# Patient Record
Sex: Male | Born: 1995 | Race: Black or African American | Hispanic: Refuse to answer | Marital: Single | Smoking: Former smoker
Health system: Southern US, Community
[De-identification: ages and names within clinical notes are randomized; demographics above are authoritative.]

## PROBLEM LIST (undated history)

## (undated) DIAGNOSIS — W3400XA Accidental discharge from unspecified firearms or gun, initial encounter: Secondary | ICD-10-CM

## (undated) HISTORY — DX: Accidental discharge from unspecified firearms or gun, initial encounter: W34.00XA

---

## 2020-08-08 ENCOUNTER — Emergency Department (HOSPITAL_COMMUNITY): Payer: BC Managed Care – PPO

## 2020-08-08 ENCOUNTER — Inpatient Hospital Stay (HOSPITAL_COMMUNITY): Payer: BC Managed Care – PPO | Admitting: Anesthesiology

## 2020-08-08 ENCOUNTER — Encounter (HOSPITAL_COMMUNITY): Payer: Self-pay | Admitting: *Deleted

## 2020-08-08 ENCOUNTER — Encounter (HOSPITAL_COMMUNITY): Admission: EM | Disposition: A | Discharge status: Rehabilitation Facility | Payer: Self-pay | Source: Home / Self Care

## 2020-08-08 ENCOUNTER — Inpatient Hospital Stay (HOSPITAL_COMMUNITY)
Admission: EM | Admit: 2020-08-08 | Discharge: 2020-08-12 | DRG: 041 | Disposition: A | Discharge status: Rehabilitation Facility | Payer: BC Managed Care – PPO | Attending: Surgery | Admitting: Surgery

## 2020-08-08 DIAGNOSIS — T1490XA Injury, unspecified, initial encounter: Secondary | ICD-10-CM

## 2020-08-08 DIAGNOSIS — R030 Elevated blood-pressure reading, without diagnosis of hypertension: Secondary | ICD-10-CM | POA: Diagnosis not present

## 2020-08-08 DIAGNOSIS — S52302D Unspecified fracture of shaft of left radius, subsequent encounter for closed fracture with routine healing: Secondary | ICD-10-CM | POA: Diagnosis not present

## 2020-08-08 DIAGNOSIS — Z20822 Contact with and (suspected) exposure to covid-19: Secondary | ICD-10-CM | POA: Diagnosis present

## 2020-08-08 DIAGNOSIS — R202 Paresthesia of skin: Secondary | ICD-10-CM | POA: Diagnosis not present

## 2020-08-08 DIAGNOSIS — W3400XA Accidental discharge from unspecified firearms or gun, initial encounter: Secondary | ICD-10-CM

## 2020-08-08 DIAGNOSIS — S5400XA Injury of ulnar nerve at forearm level, unspecified arm, initial encounter: Secondary | ICD-10-CM | POA: Diagnosis present

## 2020-08-08 DIAGNOSIS — S42402A Unspecified fracture of lower end of left humerus, initial encounter for closed fracture: Secondary | ICD-10-CM

## 2020-08-08 DIAGNOSIS — S5400XD Injury of ulnar nerve at forearm level, unspecified arm, subsequent encounter: Secondary | ICD-10-CM | POA: Diagnosis not present

## 2020-08-08 DIAGNOSIS — G8918 Other acute postprocedural pain: Secondary | ICD-10-CM

## 2020-08-08 DIAGNOSIS — F1290 Cannabis use, unspecified, uncomplicated: Secondary | ICD-10-CM | POA: Diagnosis present

## 2020-08-08 DIAGNOSIS — S52309D Unspecified fracture of shaft of unspecified radius, subsequent encounter for closed fracture with routine healing: Secondary | ICD-10-CM | POA: Diagnosis present

## 2020-08-08 DIAGNOSIS — E46 Unspecified protein-calorie malnutrition: Secondary | ICD-10-CM | POA: Diagnosis present

## 2020-08-08 DIAGNOSIS — S41132A Puncture wound without foreign body of left upper arm, initial encounter: Secondary | ICD-10-CM | POA: Diagnosis present

## 2020-08-08 DIAGNOSIS — Z23 Encounter for immunization: Secondary | ICD-10-CM | POA: Diagnosis not present

## 2020-08-08 DIAGNOSIS — S5410XA Injury of median nerve at forearm level, unspecified arm, initial encounter: Principal | ICD-10-CM | POA: Diagnosis present

## 2020-08-08 DIAGNOSIS — S45192A Other specified injury of brachial artery, left side, initial encounter: Secondary | ICD-10-CM

## 2020-08-08 DIAGNOSIS — K5903 Drug induced constipation: Secondary | ICD-10-CM | POA: Diagnosis not present

## 2020-08-08 DIAGNOSIS — T07XXXA Unspecified multiple injuries, initial encounter: Secondary | ICD-10-CM | POA: Diagnosis not present

## 2020-08-08 DIAGNOSIS — R7401 Elevation of levels of liver transaminase levels: Secondary | ICD-10-CM | POA: Diagnosis not present

## 2020-08-08 DIAGNOSIS — S71132A Puncture wound without foreign body, left thigh, initial encounter: Secondary | ICD-10-CM | POA: Diagnosis present

## 2020-08-08 DIAGNOSIS — M21372 Foot drop, left foot: Secondary | ICD-10-CM | POA: Diagnosis present

## 2020-08-08 DIAGNOSIS — S52132A Displaced fracture of neck of left radius, initial encounter for closed fracture: Secondary | ICD-10-CM | POA: Diagnosis present

## 2020-08-08 DIAGNOSIS — D62 Acute posthemorrhagic anemia: Secondary | ICD-10-CM | POA: Diagnosis present

## 2020-08-08 DIAGNOSIS — W3400XD Accidental discharge from unspecified firearms or gun, subsequent encounter: Secondary | ICD-10-CM | POA: Diagnosis not present

## 2020-08-08 DIAGNOSIS — Z6835 Body mass index (BMI) 35.0-35.9, adult: Secondary | ICD-10-CM | POA: Diagnosis not present

## 2020-08-08 DIAGNOSIS — E8809 Other disorders of plasma-protein metabolism, not elsewhere classified: Secondary | ICD-10-CM | POA: Diagnosis not present

## 2020-08-08 DIAGNOSIS — M792 Neuralgia and neuritis, unspecified: Secondary | ICD-10-CM | POA: Diagnosis not present

## 2020-08-08 DIAGNOSIS — F4311 Post-traumatic stress disorder, acute: Secondary | ICD-10-CM | POA: Diagnosis present

## 2020-08-08 HISTORY — PX: ARTERY REPAIR: SHX559

## 2020-08-08 LAB — BPAM FFP
Blood Product Expiration Date: 202110132359
Blood Product Expiration Date: 202110302359
ISSUE DATE / TIME: 202110090425
ISSUE DATE / TIME: 202110090425
Unit Type and Rh: 6200
Unit Type and Rh: 6200

## 2020-08-08 LAB — ABO/RH: ABO/RH(D): AB POS

## 2020-08-08 LAB — COMPREHENSIVE METABOLIC PANEL
ALT: 65 U/L — ABNORMAL HIGH (ref 0–44)
AST: 28 U/L (ref 15–41)
Albumin: 3.5 g/dL (ref 3.5–5.0)
Alkaline Phosphatase: 51 U/L (ref 38–126)
Anion gap: 14 (ref 5–15)
BUN: 8 mg/dL (ref 6–20)
CO2: 19 mmol/L — ABNORMAL LOW (ref 22–32)
Calcium: 8.9 mg/dL (ref 8.9–10.3)
Chloride: 105 mmol/L (ref 98–111)
Creatinine, Ser: 1.08 mg/dL (ref 0.61–1.24)
GFR, Estimated: >60 mL/min (ref >60)
Glucose, Bld: 141 mg/dL — ABNORMAL HIGH (ref 70–99)
Potassium: 3.1 mmol/L — ABNORMAL LOW (ref 3.5–5.1)
Sodium: 138 mmol/L (ref 135–145)
Total Bilirubin: 0.3 mg/dL (ref 0.3–1.2)
Total Protein: 6.5 g/dL (ref 6.5–8.1)

## 2020-08-08 LAB — PREPARE FRESH FROZEN PLASMA
Unit division: 0
Unit division: 0

## 2020-08-08 LAB — POCT I-STAT 7, (LYTES, BLD GAS, ICA,H+H)
Acid-base deficit: 3 mmol/L — ABNORMAL HIGH (ref 0.0–2.0)
Bicarbonate: 24.2 mmol/L (ref 20.0–28.0)
Calcium, Ion: 1.16 mmol/L (ref 1.15–1.40)
HCT: 35 % — ABNORMAL LOW (ref 39.0–52.0)
Hemoglobin: 11.9 g/dL — ABNORMAL LOW (ref 13.0–17.0)
O2 Saturation: 100 %
Patient temperature: 35.7
Potassium: 3.1 mmol/L — ABNORMAL LOW (ref 3.5–5.1)
Sodium: 139 mmol/L (ref 135–145)
TCO2: 26 mmol/L (ref 22–32)
pCO2 arterial: 48.4 mmHg — ABNORMAL HIGH (ref 32.0–48.0)
pH, Arterial: 7.3 — ABNORMAL LOW (ref 7.350–7.450)
pO2, Arterial: 227 mmHg — ABNORMAL HIGH (ref 83.0–108.0)

## 2020-08-08 LAB — RESPIRATORY PANEL BY RT PCR (FLU A&B, COVID)
Influenza A by PCR: NEGATIVE
Influenza B by PCR: NEGATIVE
SARS Coronavirus 2 by RT PCR: NEGATIVE

## 2020-08-08 LAB — CBC
HCT: 42.2 % (ref 39.0–52.0)
Hemoglobin: 13.8 g/dL (ref 13.0–17.0)
MCH: 27.8 pg (ref 26.0–34.0)
MCHC: 32.7 g/dL (ref 30.0–36.0)
MCV: 84.9 fL (ref 80.0–100.0)
Platelets: 233 10*3/uL (ref 150–400)
RBC: 4.97 MIL/uL (ref 4.22–5.81)
RDW: 12.6 % (ref 11.5–15.5)
WBC: 11.5 10*3/uL — ABNORMAL HIGH (ref 4.0–10.5)
nRBC: 0 % (ref 0.0–0.2)

## 2020-08-08 LAB — HIV ANTIBODY (ROUTINE TESTING W REFLEX): HIV Screen 4th Generation wRfx: NONREACTIVE

## 2020-08-08 LAB — PROTIME-INR
INR: 1.1 (ref 0.8–1.2)
Prothrombin Time: 13.5 seconds (ref 11.4–15.2)

## 2020-08-08 LAB — ETHANOL: Alcohol, Ethyl (B): 21 mg/dL — ABNORMAL HIGH (ref <10)

## 2020-08-08 LAB — LACTIC ACID, PLASMA: Lactic Acid, Venous: 4.4 mmol/L (ref 0.5–1.9)

## 2020-08-08 SURGERY — REPAIR, ARTERY, BRACHIAL
Anesthesia: General | Site: Arm Upper | Laterality: Left

## 2020-08-08 MED ORDER — ACETAMINOPHEN 10 MG/ML IV SOLN
INTRAVENOUS | Status: AC
  Filled 2020-08-08: qty 100

## 2020-08-08 MED ORDER — ONDANSETRON HCL 4 MG/2ML IJ SOLN
INTRAMUSCULAR | Status: DC | PRN
  Administered 2020-08-08: 4 mg via INTRAVENOUS

## 2020-08-08 MED ORDER — CEFAZOLIN SODIUM-DEXTROSE 2-3 GM-%(50ML) IV SOLR
INTRAVENOUS | Status: DC | PRN
  Administered 2020-08-08: 2 g via INTRAVENOUS

## 2020-08-08 MED ORDER — SUCCINYLCHOLINE 20MG/ML (10ML) SYRINGE FOR MEDFUSION PUMP - OPTIME
INTRAMUSCULAR | Status: DC | PRN
  Administered 2020-08-08: 160 mg via INTRAVENOUS

## 2020-08-08 MED ORDER — FENTANYL CITRATE (PF) 250 MCG/5ML IJ SOLN
INTRAMUSCULAR | Status: AC
  Filled 2020-08-08: qty 5

## 2020-08-08 MED ORDER — PROTAMINE SULFATE 10 MG/ML IV SOLN
INTRAVENOUS | Status: DC | PRN
  Administered 2020-08-08: 50 mg via INTRAVENOUS

## 2020-08-08 MED ORDER — HEPARIN SODIUM (PORCINE) 1000 UNIT/ML IJ SOLN
INTRAMUSCULAR | Status: DC | PRN
  Administered 2020-08-08: 5000 [IU] via INTRAVENOUS
  Administered 2020-08-08: 3000 [IU] via INTRAVENOUS

## 2020-08-08 MED ORDER — ASPIRIN EC 81 MG PO TBEC
81.0000 mg | DELAYED_RELEASE_TABLET | Freq: Every day | ORAL | Status: DC
  Administered 2020-08-09 – 2020-08-12 (×4): 81 mg via ORAL
  Filled 2020-08-08 (×5): qty 1

## 2020-08-08 MED ORDER — CEFAZOLIN SODIUM-DEXTROSE 1-4 GM/50ML-% IV SOLN
1.0000 g | Freq: Once | INTRAVENOUS | Status: AC
  Administered 2020-08-08: 1 g via INTRAVENOUS
  Filled 2020-08-08: qty 50

## 2020-08-08 MED ORDER — SODIUM CHLORIDE 0.9 % IV SOLN
INTRAVENOUS | Status: AC | PRN
  Administered 2020-08-08 (×2): 1000 mL via INTRAVENOUS

## 2020-08-08 MED ORDER — DOCUSATE SODIUM 100 MG PO CAPS
100.0000 mg | ORAL_CAPSULE | Freq: Two times a day (BID) | ORAL | Status: DC
  Administered 2020-08-08 – 2020-08-12 (×8): 100 mg via ORAL
  Filled 2020-08-08 (×8): qty 1

## 2020-08-08 MED ORDER — OXYCODONE HCL 5 MG PO TABS
5.0000 mg | ORAL_TABLET | Freq: Once | ORAL | Status: AC | PRN
  Administered 2020-08-08: 5 mg via ORAL

## 2020-08-08 MED ORDER — SODIUM CHLORIDE 0.9 % IV SOLN: INTRAVENOUS | Status: DC | PRN

## 2020-08-08 MED ORDER — 0.9 % SODIUM CHLORIDE (POUR BTL) OPTIME
TOPICAL | Status: DC | PRN
  Administered 2020-08-08: 1000 mL

## 2020-08-08 MED ORDER — FENTANYL CITRATE (PF) 100 MCG/2ML IJ SOLN
25.0000 ug | INTRAMUSCULAR | Status: DC | PRN
  Administered 2020-08-08 (×2): 50 ug via INTRAVENOUS

## 2020-08-08 MED ORDER — ETOMIDATE 2 MG/ML IV SOLN
INTRAVENOUS | Status: DC | PRN
  Administered 2020-08-08: 16 mg via INTRAVENOUS

## 2020-08-08 MED ORDER — ACETAMINOPHEN 500 MG PO TABS
1000.0000 mg | ORAL_TABLET | Freq: Four times a day (QID) | ORAL | Status: DC
  Administered 2020-08-08 – 2020-08-12 (×15): 1000 mg via ORAL
  Filled 2020-08-08 (×17): qty 2

## 2020-08-08 MED ORDER — IOHEXOL 350 MG/ML SOLN
100.0000 mL | Freq: Once | INTRAVENOUS | Status: AC | PRN
  Administered 2020-08-08: 100 mL via INTRAVENOUS

## 2020-08-08 MED ORDER — OXYCODONE HCL 5 MG PO TABS
ORAL_TABLET | ORAL | Status: AC
  Filled 2020-08-08: qty 1

## 2020-08-08 MED ORDER — DEXMEDETOMIDINE HCL IN NACL 400 MCG/100ML IV SOLN
INTRAVENOUS | Status: DC | PRN
  Administered 2020-08-08 (×2): 8 ug via INTRAVENOUS
  Administered 2020-08-08: 4 ug via INTRAVENOUS

## 2020-08-08 MED ORDER — FENTANYL CITRATE (PF) 100 MCG/2ML IJ SOLN
INTRAMUSCULAR | Status: AC | PRN
  Administered 2020-08-08: 100 ug via INTRAVENOUS

## 2020-08-08 MED ORDER — ONDANSETRON HCL 4 MG/2ML IJ SOLN: 4.0000 mg | Freq: Once | INTRAMUSCULAR | Status: DC | PRN

## 2020-08-08 MED ORDER — ONDANSETRON HCL 4 MG/2ML IJ SOLN: 4.0000 mg | Freq: Four times a day (QID) | INTRAMUSCULAR | Status: DC | PRN

## 2020-08-08 MED ORDER — LACTATED RINGERS IV SOLN: INTRAVENOUS | Status: DC

## 2020-08-08 MED ORDER — MORPHINE SULFATE (PF) 2 MG/ML IV SOLN
2.0000 mg | INTRAVENOUS | Status: DC | PRN
  Administered 2020-08-08 – 2020-08-12 (×6): 2 mg via INTRAVENOUS
  Filled 2020-08-08 (×6): qty 1

## 2020-08-08 MED ORDER — OXYCODONE HCL 5 MG/5ML PO SOLN: 5.0000 mg | Freq: Once | ORAL | Status: AC | PRN

## 2020-08-08 MED ORDER — SUGAMMADEX SODIUM 200 MG/2ML IV SOLN
INTRAVENOUS | Status: DC | PRN
  Administered 2020-08-08: 400 mg via INTRAVENOUS

## 2020-08-08 MED ORDER — SODIUM CHLORIDE 0.9 % IV SOLN
INTRAVENOUS | Status: DC | PRN
  Administered 2020-08-08: 500 mL

## 2020-08-08 MED ORDER — FENTANYL CITRATE (PF) 100 MCG/2ML IJ SOLN
INTRAMUSCULAR | Status: AC
  Filled 2020-08-08: qty 2

## 2020-08-08 MED ORDER — ONDANSETRON 4 MG PO TBDP: 4.0000 mg | ORAL_TABLET | Freq: Four times a day (QID) | ORAL | Status: DC | PRN

## 2020-08-08 MED ORDER — SODIUM CHLORIDE 0.9 % IV SOLN
INTRAVENOUS | Status: AC
  Filled 2020-08-08: qty 1.2

## 2020-08-08 MED ORDER — ACETAMINOPHEN 10 MG/ML IV SOLN
INTRAVENOUS | Status: DC | PRN
  Administered 2020-08-08: 1000 mg via INTRAVENOUS

## 2020-08-08 MED ORDER — DEXAMETHASONE SODIUM PHOSPHATE 10 MG/ML IJ SOLN
INTRAMUSCULAR | Status: DC | PRN
  Administered 2020-08-08: 10 mg via INTRAVENOUS

## 2020-08-08 MED ORDER — TETANUS-DIPHTH-ACELL PERTUSSIS 5-2.5-18.5 LF-MCG/0.5 IM SUSP
0.5000 mL | Freq: Once | INTRAMUSCULAR | Status: AC
  Administered 2020-08-08: 0.5 mL via INTRAMUSCULAR
  Filled 2020-08-08: qty 0.5

## 2020-08-08 MED ORDER — FENTANYL CITRATE (PF) 250 MCG/5ML IJ SOLN
INTRAMUSCULAR | Status: DC | PRN
Start: 2020-08-08 — End: 2020-08-08
  Administered 2020-08-08: 150 ug via INTRAVENOUS
  Administered 2020-08-08: 50 ug via INTRAVENOUS
  Administered 2020-08-08 (×2): 25 ug via INTRAVENOUS

## 2020-08-08 MED ORDER — ROCURONIUM 10MG/ML (10ML) SYRINGE FOR MEDFUSION PUMP - OPTIME
INTRAVENOUS | Status: DC | PRN
  Administered 2020-08-08: 60 mg via INTRAVENOUS
  Administered 2020-08-08: 20 mg via INTRAVENOUS

## 2020-08-08 MED ORDER — LACTATED RINGERS IV SOLN: INTRAVENOUS | Status: DC | PRN

## 2020-08-08 MED ORDER — OXYCODONE HCL 5 MG PO TABS
5.0000 mg | ORAL_TABLET | ORAL | Status: DC | PRN
  Administered 2020-08-08 – 2020-08-10 (×6): 10 mg via ORAL
  Administered 2020-08-10 – 2020-08-11 (×3): 5 mg via ORAL
  Administered 2020-08-11 (×4): 10 mg via ORAL
  Administered 2020-08-11: 5 mg via ORAL
  Administered 2020-08-12 (×2): 10 mg via ORAL
  Filled 2020-08-08: qty 1
  Filled 2020-08-08 (×3): qty 2
  Filled 2020-08-08: qty 1
  Filled 2020-08-08 (×3): qty 2
  Filled 2020-08-08: qty 1
  Filled 2020-08-08 (×6): qty 2
  Filled 2020-08-08: qty 1

## 2020-08-08 SURGICAL SUPPLY — 74 items
ADH SKN CLS APL DERMABOND .7 (GAUZE/BANDAGES/DRESSINGS) ×4
BNDG CMPR 9X4 STRL LF SNTH (GAUZE/BANDAGES/DRESSINGS)
BNDG ELASTIC 4X5.8 VLCR STR LF (GAUZE/BANDAGES/DRESSINGS) ×3 IMPLANT
BNDG ESMARK 4X9 LF (GAUZE/BANDAGES/DRESSINGS) IMPLANT
CANISTER SUCT 3000ML PPV (MISCELLANEOUS) ×6 IMPLANT
CATH EMB 3FR 40CM (CATHETERS) ×3 IMPLANT
CATH EMB 4FR 80CM (CATHETERS) IMPLANT
CATH EMB 5FR 80CM (CATHETERS) IMPLANT
CLIP VESOCCLUDE MED 24/CT (CLIP) ×3 IMPLANT
CLIP VESOCCLUDE SM WIDE 24/CT (CLIP) ×3 IMPLANT
COVER WAND RF STERILE (DRAPES) IMPLANT
CUFF TOURN SGL QUICK 18X4 (TOURNIQUET CUFF) IMPLANT
CUFF TOURN SGL QUICK 24 (TOURNIQUET CUFF)
CUFF TRNQT CYL 24X4X16.5-23 (TOURNIQUET CUFF) IMPLANT
DECANTER SPIKE VIAL GLASS SM (MISCELLANEOUS) IMPLANT
DERMABOND ADVANCED (GAUZE/BANDAGES/DRESSINGS) ×2
DERMABOND ADVANCED .7 DNX12 (GAUZE/BANDAGES/DRESSINGS) ×4 IMPLANT
DRAIN CHANNEL 15F RND FF W/TCR (WOUND CARE) IMPLANT
DRAPE ORTHO SPLIT 77X108 STRL (DRAPES) ×3
DRAPE SURG ORHT 6 SPLT 77X108 (DRAPES) ×2 IMPLANT
DRAPE X-RAY CASS 24X20 (DRAPES) IMPLANT
DRSG ADAPTIC 3X8 NADH LF (GAUZE/BANDAGES/DRESSINGS) ×3 IMPLANT
ELECT REM PT RETURN 9FT ADLT (ELECTROSURGICAL) ×6
ELECTRODE REM PT RTRN 9FT ADLT (ELECTROSURGICAL) ×4 IMPLANT
EVACUATOR SILICONE 100CC (DRAIN) IMPLANT
GAUZE SPONGE 4X4 12PLY STRL LF (GAUZE/BANDAGES/DRESSINGS) ×3 IMPLANT
GAUZE XEROFORM 5X9 LF (GAUZE/BANDAGES/DRESSINGS) ×3 IMPLANT
GLOVE BIO SURGEON STRL SZ 6.5 (GLOVE) ×3 IMPLANT
GLOVE BIO SURGEON STRL SZ7 (GLOVE) ×6 IMPLANT
GLOVE BIO SURGEON STRL SZ7.5 (GLOVE) ×9 IMPLANT
GLOVE BIOGEL PI IND STRL 6.5 (GLOVE) ×2 IMPLANT
GLOVE BIOGEL PI IND STRL 7.0 (GLOVE) ×2 IMPLANT
GLOVE BIOGEL PI IND STRL 7.5 (GLOVE) ×4 IMPLANT
GLOVE BIOGEL PI IND STRL 8 (GLOVE) ×2 IMPLANT
GLOVE BIOGEL PI INDICATOR 6.5 (GLOVE) ×1
GLOVE BIOGEL PI INDICATOR 7.0 (GLOVE) ×1
GLOVE BIOGEL PI INDICATOR 7.5 (GLOVE) ×2
GLOVE BIOGEL PI INDICATOR 8 (GLOVE) ×1
GLOVE SURG SS PI 6.5 STRL IVOR (GLOVE) ×3 IMPLANT
GLOVE SURG SS PI 7.5 STRL IVOR (GLOVE) ×3 IMPLANT
GOWN STRL REUS W/ TWL LRG LVL3 (GOWN DISPOSABLE) ×12 IMPLANT
GOWN STRL REUS W/ TWL XL LVL3 (GOWN DISPOSABLE) ×4 IMPLANT
GOWN STRL REUS W/TWL LRG LVL3 (GOWN DISPOSABLE) ×18
GOWN STRL REUS W/TWL XL LVL3 (GOWN DISPOSABLE) ×6
HEMOSTAT SNOW SURGICEL 2X4 (HEMOSTASIS) IMPLANT
KIT BASIN OR (CUSTOM PROCEDURE TRAY) ×3 IMPLANT
KIT TURNOVER KIT B (KITS) ×6 IMPLANT
NS IRRIG 1000ML POUR BTL (IV SOLUTION) ×9 IMPLANT
PACK CV ACCESS (CUSTOM PROCEDURE TRAY) IMPLANT
PACK PERIPHERAL VASCULAR (CUSTOM PROCEDURE TRAY) ×3 IMPLANT
PAD ARMBOARD 7.5X6 YLW CONV (MISCELLANEOUS) ×6 IMPLANT
PAD CAST 4YDX4 CTTN HI CHSV (CAST SUPPLIES) ×8 IMPLANT
PADDING CAST COTTON 4X4 STRL (CAST SUPPLIES) ×12
SET COLLECT BLD 21X3/4 12 (NEEDLE) IMPLANT
SPLINT FIBERGLASS 4X30 (CAST SUPPLIES) ×3 IMPLANT
STOPCOCK 4 WAY LG BORE MALE ST (IV SETS) IMPLANT
SUT ETHILON 3 0 PS 1 (SUTURE) ×6 IMPLANT
SUT MNCRL AB 4-0 PS2 18 (SUTURE) IMPLANT
SUT PROLENE 5 0 C 1 24 (SUTURE) IMPLANT
SUT PROLENE 6 0 BV (SUTURE) ×6 IMPLANT
SUT SILK 3 0 (SUTURE)
SUT SILK 3-0 18XBRD TIE 12 (SUTURE) IMPLANT
SUT VIC AB 2-0 CT1 27 (SUTURE) ×6
SUT VIC AB 2-0 CT1 TAPERPNT 27 (SUTURE) ×4 IMPLANT
SUT VIC AB 3-0 SH 27 (SUTURE) ×12
SUT VIC AB 3-0 SH 27X BRD (SUTURE) ×8 IMPLANT
SUT VIC AB 4-0 PS2 18 (SUTURE) ×9 IMPLANT
SYR 3ML LL SCALE MARK (SYRINGE) ×3 IMPLANT
TAPE CLOTH SURG 4X10 WHT LF (GAUZE/BANDAGES/DRESSINGS) ×3 IMPLANT
TOWEL GREEN STERILE (TOWEL DISPOSABLE) ×6 IMPLANT
TRAY FOLEY MTR SLVR 16FR STAT (SET/KITS/TRAYS/PACK) IMPLANT
TUBING EXTENTION W/L.L. (IV SETS) IMPLANT
UNDERPAD 30X36 HEAVY ABSORB (UNDERPADS AND DIAPERS) ×6 IMPLANT
WATER STERILE IRR 1000ML POUR (IV SOLUTION) ×6 IMPLANT

## 2020-08-08 NOTE — Anesthesia Procedure Notes (Signed)
Arterial Line Insertion Start/End10/07/2020 5:05 AM, 08/08/2020 5:10 AM Performed by: Kipp Brood, MD, anesthesiologist  Preanesthetic checklist: patient identified and monitors and equipment checked Right, radial was placed Catheter size: 20 G Hand hygiene performed  and maximum sterile barriers used   Attempts: 2 Procedure performed without using ultrasound guided technique. Following insertion, dressing applied and Biopatch. Patient tolerated the procedure well with no immediate complications.

## 2020-08-08 NOTE — ED Notes (Signed)
Transported with bobby, rn to OR

## 2020-08-08 NOTE — ED Notes (Signed)
Per bobby rn, he took the patients clothing and gave to chaplin who gave to patient family prior to the patient going to the OR
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
## 2020-08-08 NOTE — ED Notes (Signed)
Per commander, md pt is ready to go to or, waiting to receive ok to transport to or

## 2020-08-08 NOTE — ED Notes (Signed)
Patient transported to CT 

## 2020-08-08 NOTE — Anesthesia Postprocedure Evaluation (Signed)
Anesthesia Post Note  Patient: Steve [name]  Procedure(s) Performed: Exploration Left Brachial Artery Sheath; Left Greater Saphenous Vein Harvest; Embolectomy Brachial Artery; Interposition Bypass Left Brachial-Left Brachial (Left Arm Upper)     Patient location during evaluation: PACU Anesthesia Type: General Level of consciousness: awake and alert Pain management: satisfactory to patient Vital Signs Assessment: post-procedure vital signs reviewed and stable Respiratory status: spontaneous breathing, nonlabored ventilation and respiratory function stable Cardiovascular status: blood pressure returned to baseline and stable Postop Assessment: no apparent nausea or vomiting Anesthetic complications: no   No complications documented.  Last Vitals:  Vitals:   08/08/20 0830 08/08/20 0845  BP: (!) 163/96 (!) 145/96  Pulse: 82 67  Resp: (!) 22 17  Temp:  (!) 36.3 C  SpO2: 98% 100%    Last Pain:  Vitals:   08/08/20 0845  TempSrc:   PainSc: 8     LLE Motor Response: Purposeful movement (08/08/20 0845) LLE Sensation: Full sensation (08/08/20 0845) RLE Motor Response: Purposeful movement (08/08/20 0845) RLE Sensation: Full sensation (08/08/20 0845)      Beryle Lathe

## 2020-08-08 NOTE — ED Notes (Signed)
Upgraded to level 1, CT 2 is ready for the patient.
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
## 2020-08-08 NOTE — Transfer of Care (Signed)
Immediate Anesthesia Transfer of Care Note  Patient: Steve [name]  Procedure(s) Performed: BRACHIAL ARTERY REPAIR (Left )  Patient Location: PACU  Anesthesia Type:General  Level of Consciousness: drowsy  Airway & Oxygen Therapy: Patient Spontanous Breathing and Patient connected to face mask oxygen  Post-op Assessment: Report given to RN and Post -op Vital signs reviewed and stable  Post vital signs: Reviewed and stable  Last Vitals:  Vitals Value Taken Time  BP 131/93 08/08/20 0745  Temp    Pulse 85 08/08/20 0756  Resp 19 08/08/20 0756  SpO2 100 % 08/08/20 0756  Vitals shown include unvalidated device data.  Last Pain:  Vitals:   08/08/20 0445  TempSrc: Temporal  PainSc:          Complications: No complications documented.

## 2020-08-08 NOTE — Anesthesia Preprocedure Evaluation (Signed)
Anesthesia Evaluation  Patient identified by MRN, date of birth, ID band Patient awake    Reviewed: Patient's Chart, lab work & pertinent test results, Unable to perform ROS - Chart review onlyPreop documentation limited or incomplete due to emergent nature of procedure.  Airway Mallampati: III   Neck ROM: Full    Dental  (+) Teeth Intact   Pulmonary    breath sounds clear to auscultation       Cardiovascular  Rhythm:Regular Rate:Tachycardia     Neuro/Psych    GI/Hepatic   Endo/Other    Renal/GU      Musculoskeletal   Abdominal (+) + obese,   Peds  Hematology   Anesthesia Other Findings Wound   Reproductive/Obstetrics                             Anesthesia Physical Anesthesia Plan  ASA: IV  Anesthesia Plan: General   Post-op Pain Management:    Induction: Intravenous, Cricoid pressure planned and Rapid sequence  PONV Risk Score and Plan: Ondansetron and Dexamethasone  Airway Management Planned: Oral ETT  Additional Equipment:   Intra-op Plan:   Post-operative Plan: Extubation in OR  Informed Consent: I have reviewed the patients History and Physical, chart, labs and discussed the procedure including the risks, benefits and alternatives for the proposed anesthesia with the patient or authorized representative who has indicated his/her understanding and acceptance.       Plan Discussed with: CRNA and Anesthesiologist  Anesthesia Plan Comments:         Anesthesia Quick Evaluation

## 2020-08-08 NOTE — ED Notes (Signed)
Family placed in family consult
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
## 2020-08-08 NOTE — Progress Notes (Signed)
Orthopedic Tech Progress Note Patient Details:  [name] [employer] [name] 10/31/1876 [id number] Level 2 Trauma  Patient ID: Steve [name], male   DOB: 10/31/1876, 24 y.o.   MRN: [id number]   [name] 08/08/2020, 3:59 AM
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
## 2020-08-08 NOTE — Anesthesia Procedure Notes (Signed)
Procedure Name: Intubation Date/Time: 08/08/2020 5:07 AM Performed by: Molli Hazard, CRNA Pre-anesthesia Checklist: Patient identified, Emergency Drugs available, Suction available and Patient being monitored Patient Re-evaluated:Patient Re-evaluated prior to induction Oxygen Delivery Method: Circle system utilized Preoxygenation: Pre-oxygenation with 100% oxygen Induction Type: IV induction, Rapid sequence and Cricoid Pressure applied Laryngoscope Size: Miller and 2 Grade View: Grade I Tube type: Oral Tube size: 7.5 mm Number of attempts: 1 Airway Equipment and Method: Stylet Secured at: 21 cm Tube secured with: Tape Dental Injury: Teeth and Oropharynx as per pre-operative assessment

## 2020-08-08 NOTE — Progress Notes (Signed)
   Patient evaluated in his room.  He has pain in his left upper extremity as well as bilateral lower extremities left greater than right.  Palpable left radial artery pulse.  Palpable bilateral dorsalis pedis pulses all extremities appear well-perfused.  He needs aspirin and pulse checks from a vascular standpoint.  Raissa Dam C. Randie Heinz, MD [facility] Office: [phone] Pager: [phone]

## 2020-08-09 ENCOUNTER — Encounter (HOSPITAL_COMMUNITY): Payer: Self-pay | Admitting: Vascular Surgery

## 2020-08-09 ENCOUNTER — Inpatient Hospital Stay (HOSPITAL_COMMUNITY): Payer: BC Managed Care – PPO

## 2020-08-09 LAB — TYPE AND SCREEN
ABO/RH(D): AB POS
Antibody Screen: NEGATIVE
Unit division: 0
Unit division: 0
Unit division: 0

## 2020-08-09 LAB — CBC
HCT: 33.7 % — ABNORMAL LOW (ref 39.0–52.0)
Hemoglobin: 10.8 g/dL — ABNORMAL LOW (ref 13.0–17.0)
MCH: 27.1 pg (ref 26.0–34.0)
MCHC: 32 g/dL (ref 30.0–36.0)
MCV: 84.5 fL (ref 80.0–100.0)
Platelets: 169 10*3/uL (ref 150–400)
RBC: 3.99 MIL/uL — ABNORMAL LOW (ref 4.22–5.81)
RDW: 13 % (ref 11.5–15.5)
WBC: 14.8 10*3/uL — ABNORMAL HIGH (ref 4.0–10.5)
nRBC: 0 % (ref 0.0–0.2)

## 2020-08-09 LAB — BPAM RBC
Blood Product Expiration Date: 202110232359
Blood Product Expiration Date: 202110232359
Blood Product Expiration Date: 202110252359
ISSUE DATE / TIME: 202110090417
ISSUE DATE / TIME: 202110090915
ISSUE DATE / TIME: 202110090915
Unit Type and Rh: 5100
Unit Type and Rh: 5100
Unit Type and Rh: 5100

## 2020-08-09 LAB — BASIC METABOLIC PANEL
Anion gap: 7 (ref 5–15)
BUN: 6 mg/dL (ref 6–20)
CO2: 27 mmol/L (ref 22–32)
Calcium: 8.7 mg/dL — ABNORMAL LOW (ref 8.9–10.3)
Chloride: 105 mmol/L (ref 98–111)
Creatinine, Ser: 0.72 mg/dL (ref 0.61–1.24)
GFR, Estimated: >60 mL/min (ref >60)
Glucose, Bld: 132 mg/dL — ABNORMAL HIGH (ref 70–99)
Potassium: 4 mmol/L (ref 3.5–5.1)
Sodium: 139 mmol/L (ref 135–145)

## 2020-08-09 LAB — BLOOD PRODUCT ORDER (VERBAL) VERIFICATION

## 2020-08-09 MED ORDER — METHOCARBAMOL 500 MG PO TABS
1000.0000 mg | ORAL_TABLET | Freq: Three times a day (TID) | ORAL | Status: DC
  Administered 2020-08-09 – 2020-08-12 (×10): 1000 mg via ORAL
  Filled 2020-08-09 (×10): qty 2

## 2020-08-09 MED ORDER — GABAPENTIN 300 MG PO CAPS
300.0000 mg | ORAL_CAPSULE | Freq: Three times a day (TID) | ORAL | Status: DC
  Administered 2020-08-09 (×2): 300 mg via ORAL
  Filled 2020-08-09 (×3): qty 1

## 2020-08-09 MED ORDER — ENOXAPARIN SODIUM 30 MG/0.3ML ~~LOC~~ SOLN
30.0000 mg | Freq: Two times a day (BID) | SUBCUTANEOUS | Status: DC
  Administered 2020-08-09 – 2020-08-12 (×7): 30 mg via SUBCUTANEOUS
  Filled 2020-08-09 (×7): qty 0.3

## 2020-08-09 MED ORDER — ENOXAPARIN SODIUM 40 MG/0.4ML ~~LOC~~ SOLN: 40.0000 mg | SUBCUTANEOUS | Status: DC
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
## 2020-08-10 LAB — BASIC METABOLIC PANEL
Anion gap: 8 (ref 5–15)
BUN: 8 mg/dL (ref 6–20)
CO2: 26 mmol/L (ref 22–32)
Calcium: 8.7 mg/dL — ABNORMAL LOW (ref 8.9–10.3)
Chloride: 104 mmol/L (ref 98–111)
Creatinine, Ser: 0.77 mg/dL (ref 0.61–1.24)
GFR, Estimated: >60 mL/min (ref >60)
Glucose, Bld: 98 mg/dL (ref 70–99)
Potassium: 4.1 mmol/L (ref 3.5–5.1)
Sodium: 138 mmol/L (ref 135–145)

## 2020-08-10 LAB — CBC
HCT: 31.4 % — ABNORMAL LOW (ref 39.0–52.0)
Hemoglobin: 10 g/dL — ABNORMAL LOW (ref 13.0–17.0)
MCH: 27.6 pg (ref 26.0–34.0)
MCHC: 31.8 g/dL (ref 30.0–36.0)
MCV: 86.7 fL (ref 80.0–100.0)
Platelets: 147 10*3/uL — ABNORMAL LOW (ref 150–400)
RBC: 3.62 MIL/uL — ABNORMAL LOW (ref 4.22–5.81)
RDW: 13.1 % (ref 11.5–15.5)
WBC: 9.4 10*3/uL (ref 4.0–10.5)
nRBC: 0 % (ref 0.0–0.2)

## 2020-08-10 MED ORDER — POLYETHYLENE GLYCOL 3350 17 G PO PACK: 17.0000 g | PACK | Freq: Every day | ORAL | Status: DC

## 2020-08-10 MED ORDER — TRAMADOL HCL 50 MG PO TABS
50.0000 mg | ORAL_TABLET | Freq: Four times a day (QID) | ORAL | Status: DC
  Administered 2020-08-10 – 2020-08-12 (×9): 50 mg via ORAL
  Filled 2020-08-10 (×9): qty 1

## 2020-08-10 MED ORDER — GABAPENTIN 400 MG PO CAPS
400.0000 mg | ORAL_CAPSULE | Freq: Three times a day (TID) | ORAL | Status: DC
  Administered 2020-08-10 (×3): 400 mg via ORAL
  Filled 2020-08-10 (×2): qty 1

## 2020-08-10 NOTE — TOC CAGE-AID Note (Signed)
Transition of Care [facility]) - CAGE-AID Screening   Patient Details  Name: Steve [name] MRN: [id number] Date of Birth: [birth year]  [facility] of [facility]) CM/SW Contact:    Emeterio Reeve, [place] Phone Number: 08/10/2020, 1:50 PM   Clinical Narrative:  CSW met with pt at bedside. CSW introduced self and explained her role at the hospital.  Pt reports he has an alcoholic beverage a few times a month. Pt denies substance use. Pt reports he wants to stop smoking cigarettes resources. CSW gave pt smoking cessation resources. Pt did not need any other resources at this time.   CAGE-AID Screening:    Have You Ever Felt You Ought to Cut Down on Your Drinking or Drug Use?: No Have People Annoyed You By Critizing Your Drinking Or Drug Use?: No Have You Felt Bad Or Guilty About Your Drinking Or Drug Use?: No Have You Ever Had a Drink or Used Drugs First Thing In The Morning to Steady Your Nerves or to Get Rid of a Hangover?: No CAGE-AID Score: 0  Substance Abuse Education Offered: Yes     Blima Ledger, [place] Social Worker [phone]
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
## 2020-08-10 NOTE — Progress Notes (Signed)
Inpatient Rehab Admissions Coordinator Note:   Per PT recommendations, pt was screened for CIR candidacy by Estill Dooms, PT, DPT.  At this time we are recommending a CIR consult and I will place an order per our protocol.  Please contact me with questions.   Estill Dooms, PT, DPT [phone] 08/10/20 10:27 AM
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
## 2020-08-11 ENCOUNTER — Telehealth: Payer: Self-pay

## 2020-08-11 LAB — BASIC METABOLIC PANEL
Anion gap: 10 (ref 5–15)
BUN: 6 mg/dL (ref 6–20)
CO2: 25 mmol/L (ref 22–32)
Calcium: 9 mg/dL (ref 8.9–10.3)
Chloride: 103 mmol/L (ref 98–111)
Creatinine, Ser: 0.68 mg/dL (ref 0.61–1.24)
GFR, Estimated: >60 mL/min (ref >60)
Glucose, Bld: 85 mg/dL (ref 70–99)
Potassium: 3.9 mmol/L (ref 3.5–5.1)
Sodium: 138 mmol/L (ref 135–145)

## 2020-08-11 LAB — CBC
HCT: 32.4 % — ABNORMAL LOW (ref 39.0–52.0)
Hemoglobin: 10.3 g/dL — ABNORMAL LOW (ref 13.0–17.0)
MCH: 27.2 pg (ref 26.0–34.0)
MCHC: 31.8 g/dL (ref 30.0–36.0)
MCV: 85.5 fL (ref 80.0–100.0)
Platelets: 181 10*3/uL (ref 150–400)
RBC: 3.79 MIL/uL — ABNORMAL LOW (ref 4.22–5.81)
RDW: 13.1 % (ref 11.5–15.5)
WBC: 9.9 10*3/uL (ref 4.0–10.5)
nRBC: 0 % (ref 0.0–0.2)

## 2020-08-11 MED ORDER — GABAPENTIN 300 MG PO CAPS
600.0000 mg | ORAL_CAPSULE | Freq: Three times a day (TID) | ORAL | Status: DC
  Administered 2020-08-11 – 2020-08-12 (×4): 600 mg via ORAL
  Filled 2020-08-11 (×4): qty 2

## 2020-08-11 MED ORDER — POLYETHYLENE GLYCOL 3350 17 G PO PACK
17.0000 g | PACK | Freq: Two times a day (BID) | ORAL | Status: DC
  Administered 2020-08-11 – 2020-08-12 (×3): 17 g via ORAL
  Filled 2020-08-11 (×3): qty 1

## 2020-08-11 NOTE — Progress Notes (Signed)
Report called to [facility] RN. Patient and all belongings transported via bed without incident. Patient mother made aware of change in room.

## 2020-08-11 NOTE — Telephone Encounter (Signed)
Spoke to Ward from PT at [facility]. She asked if her was non weight bearing on the arm and as tolerated on the leg. Discussed with PA, nothing would preclude this from our standpoint but instructed Morrie Sheldon to confirm with ortho.
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
## 2020-08-11 NOTE — Progress Notes (Signed)
Inpatient Rehabilitation Admissions Coordinator  I await bed availability in the next 24 to 48 hrs for a Cir admit. I met at bedside with patient and he is in agreement.  Danne Baxter, RN, MSN Rehab Admissions Coordinator [phone] 08/11/2020 1:33 PM

## 2020-08-11 NOTE — Progress Notes (Addendum)
°  Progress Note    08/11/2020 7:53 AM 3 Days Post-Op  Subjective: numbness in fingers of L hand however motor is intact   Vitals:   08/11/20 0400 08/11/20 0753  BP: (!) 153/89 137/83  Pulse: 70 70  Resp: 18 17  Temp: 98.3 F (36.8 C) 97.9 F (36.6 C)  SpO2: 100% 100%   Physical Exam: Lungs:  Non labored Incisions:  Ortho brace left in place Extremities:  Palpable L radial pulse Neurologic: A&O  CBC    Component Value Date/Time   WBC 9.9 08/11/2020 0405   RBC 3.79 (L) 08/11/2020 0405   HGB 10.3 (L) 08/11/2020 0405   HCT 32.4 (L) 08/11/2020 0405   PLT 181 08/11/2020 0405   MCV 85.5 08/11/2020 0405   MCH 27.2 08/11/2020 0405   MCHC 31.8 08/11/2020 0405   RDW 13.1 08/11/2020 0405    BMET    Component Value Date/Time   NA 138 08/11/2020 0405   K 3.9 08/11/2020 0405   CL 103 08/11/2020 0405   CO2 25 08/11/2020 0405   GLUCOSE 85 08/11/2020 0405   BUN 6 08/11/2020 0405   CREATININE 0.68 08/11/2020 0405   CALCIUM 9.0 08/11/2020 0405   GFRNONAA >60 08/11/2020 0405    INR    Component Value Date/Time   INR 1.1 08/08/2020 0400     Intake/Output Summary (Last 24 hours) at 08/11/2020 0753 Last data filed at 08/11/2020 0100 Gross per 24 hour  Intake 380 ml  Output 850 ml  Net -470 ml     Assessment/Plan:  24 y.o. male is s/p repair of L brachial artery with L saphenous interposition graft 3 Days Post-Op   L hand well perfused with palpable radial pulse L saphenous harvest incision unremarkable; unable to examine L UE incision with ortho brace Continue aspirin Transfer to floor ok from vascular standpoint   Emilie Rutter, PA-C [facility] [phone] 08/11/2020 7:53 AM  I have independently interviewed and examined patient and agree with PA assessment and plan above.   Lianah Peed C. Randie Heinz, MD [facility] [place] Office: [phone] Pager: [phone]
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
## 2020-08-12 ENCOUNTER — Encounter (HOSPITAL_COMMUNITY): Payer: Self-pay | Admitting: Physical Medicine & Rehabilitation

## 2020-08-12 ENCOUNTER — Inpatient Hospital Stay (HOSPITAL_COMMUNITY)
Admission: RE | Admit: 2020-08-12 | Discharge: 2020-08-21 | DRG: 560 | Disposition: A | Discharge status: Home Health | Payer: BC Managed Care – PPO | Source: Intra-hospital | Attending: Physical Medicine & Rehabilitation | Admitting: Physical Medicine & Rehabilitation

## 2020-08-12 DIAGNOSIS — W3400XD Accidental discharge from unspecified firearms or gun, subsequent encounter: Secondary | ICD-10-CM | POA: Diagnosis not present

## 2020-08-12 DIAGNOSIS — S5400XD Injury of ulnar nerve at forearm level, unspecified arm, subsequent encounter: Secondary | ICD-10-CM | POA: Diagnosis not present

## 2020-08-12 DIAGNOSIS — K5903 Drug induced constipation: Secondary | ICD-10-CM | POA: Diagnosis not present

## 2020-08-12 DIAGNOSIS — S52302D Unspecified fracture of shaft of left radius, subsequent encounter for closed fracture with routine healing: Principal | ICD-10-CM

## 2020-08-12 DIAGNOSIS — M792 Neuralgia and neuritis, unspecified: Secondary | ICD-10-CM

## 2020-08-12 DIAGNOSIS — M21372 Foot drop, left foot: Secondary | ICD-10-CM | POA: Diagnosis not present

## 2020-08-12 DIAGNOSIS — E46 Unspecified protein-calorie malnutrition: Secondary | ICD-10-CM | POA: Diagnosis present

## 2020-08-12 DIAGNOSIS — F4311 Post-traumatic stress disorder, acute: Secondary | ICD-10-CM

## 2020-08-12 DIAGNOSIS — W3400XA Accidental discharge from unspecified firearms or gun, initial encounter: Secondary | ICD-10-CM | POA: Diagnosis not present

## 2020-08-12 DIAGNOSIS — R52 Pain, unspecified: Secondary | ICD-10-CM | POA: Diagnosis not present

## 2020-08-12 DIAGNOSIS — D62 Acute posthemorrhagic anemia: Secondary | ICD-10-CM | POA: Diagnosis present

## 2020-08-12 DIAGNOSIS — Z6835 Body mass index (BMI) 35.0-35.9, adult: Secondary | ICD-10-CM

## 2020-08-12 DIAGNOSIS — G8918 Other acute postprocedural pain: Secondary | ICD-10-CM

## 2020-08-12 DIAGNOSIS — R7401 Elevation of levels of liver transaminase levels: Secondary | ICD-10-CM

## 2020-08-12 DIAGNOSIS — M7989 Other specified soft tissue disorders: Secondary | ICD-10-CM | POA: Diagnosis not present

## 2020-08-12 DIAGNOSIS — T07XXXA Unspecified multiple injuries, initial encounter: Secondary | ICD-10-CM

## 2020-08-12 DIAGNOSIS — R202 Paresthesia of skin: Secondary | ICD-10-CM

## 2020-08-12 DIAGNOSIS — R030 Elevated blood-pressure reading, without diagnosis of hypertension: Secondary | ICD-10-CM | POA: Diagnosis not present

## 2020-08-12 DIAGNOSIS — S52309D Unspecified fracture of shaft of unspecified radius, subsequent encounter for closed fracture with routine healing: Secondary | ICD-10-CM | POA: Diagnosis present

## 2020-08-12 DIAGNOSIS — E8809 Other disorders of plasma-protein metabolism, not elsewhere classified: Secondary | ICD-10-CM | POA: Diagnosis not present

## 2020-08-12 DIAGNOSIS — R609 Edema, unspecified: Secondary | ICD-10-CM | POA: Diagnosis not present

## 2020-08-12 LAB — BASIC METABOLIC PANEL
Anion gap: 7 (ref 5–15)
BUN: 8 mg/dL (ref 6–20)
CO2: 27 mmol/L (ref 22–32)
Calcium: 8.7 mg/dL — ABNORMAL LOW (ref 8.9–10.3)
Chloride: 103 mmol/L (ref 98–111)
Creatinine, Ser: 0.8 mg/dL (ref 0.61–1.24)
GFR, Estimated: >60 mL/min (ref >60)
Glucose, Bld: 110 mg/dL — ABNORMAL HIGH (ref 70–99)
Potassium: 4.4 mmol/L (ref 3.5–5.1)
Sodium: 137 mmol/L (ref 135–145)

## 2020-08-12 LAB — CBC
HCT: 32.5 % — ABNORMAL LOW (ref 39.0–52.0)
Hemoglobin: 10.4 g/dL — ABNORMAL LOW (ref 13.0–17.0)
MCH: 27.4 pg (ref 26.0–34.0)
MCHC: 32 g/dL (ref 30.0–36.0)
MCV: 85.8 fL (ref 80.0–100.0)
Platelets: 162 10*3/uL (ref 150–400)
RBC: 3.79 MIL/uL — ABNORMAL LOW (ref 4.22–5.81)
RDW: 13 % (ref 11.5–15.5)
WBC: 10.7 10*3/uL — ABNORMAL HIGH (ref 4.0–10.5)
nRBC: 0 % (ref 0.0–0.2)

## 2020-08-12 MED ORDER — OXYCODONE HCL 5 MG PO TABS
5.0000 mg | ORAL_TABLET | ORAL | Status: DC | PRN
  Administered 2020-08-12 – 2020-08-18 (×30): 10 mg via ORAL
  Administered 2020-08-18: 5 mg via ORAL
  Administered 2020-08-18: 10 mg via ORAL
  Administered 2020-08-18: 5 mg via ORAL
  Administered 2020-08-19 – 2020-08-20 (×4): 10 mg via ORAL
  Filled 2020-08-12 (×33): qty 2
  Filled 2020-08-12: qty 1
  Filled 2020-08-12 (×6): qty 2

## 2020-08-12 MED ORDER — ENOXAPARIN SODIUM 30 MG/0.3ML ~~LOC~~ SOLN: 30.0000 mg | Freq: Two times a day (BID) | SUBCUTANEOUS | Status: DC

## 2020-08-12 MED ORDER — ONDANSETRON 4 MG PO TBDP: 4.0000 mg | ORAL_TABLET | Freq: Four times a day (QID) | ORAL | Status: DC | PRN

## 2020-08-12 MED ORDER — ENOXAPARIN SODIUM 30 MG/0.3ML ~~LOC~~ SOLN
30.0000 mg | Freq: Two times a day (BID) | SUBCUTANEOUS | Status: DC
  Administered 2020-08-13 – 2020-08-21 (×17): 30 mg via SUBCUTANEOUS
  Filled 2020-08-12 (×17): qty 0.3

## 2020-08-12 MED ORDER — ASPIRIN EC 81 MG PO TBEC
81.0000 mg | DELAYED_RELEASE_TABLET | Freq: Every day | ORAL | Status: DC
  Administered 2020-08-13 – 2020-08-21 (×9): 81 mg via ORAL
  Filled 2020-08-12 (×9): qty 1

## 2020-08-12 MED ORDER — TRAMADOL HCL 50 MG PO TABS
50.0000 mg | ORAL_TABLET | Freq: Four times a day (QID) | ORAL | Status: DC
  Administered 2020-08-12 – 2020-08-21 (×32): 50 mg via ORAL
  Filled 2020-08-12 (×35): qty 1

## 2020-08-12 MED ORDER — GABAPENTIN 300 MG PO CAPS
600.0000 mg | ORAL_CAPSULE | Freq: Three times a day (TID) | ORAL | Status: DC
  Administered 2020-08-12 – 2020-08-13 (×2): 600 mg via ORAL
  Filled 2020-08-12 (×2): qty 2

## 2020-08-12 MED ORDER — ACETAMINOPHEN 325 MG PO TABS
325.0000 mg | ORAL_TABLET | ORAL | Status: DC | PRN
  Administered 2020-08-12 – 2020-08-21 (×15): 650 mg via ORAL
  Filled 2020-08-12 (×15): qty 2

## 2020-08-12 MED ORDER — POLYETHYLENE GLYCOL 3350 17 G PO PACK
17.0000 g | PACK | Freq: Two times a day (BID) | ORAL | Status: DC
  Administered 2020-08-12 – 2020-08-20 (×13): 17 g via ORAL
  Filled 2020-08-12 (×18): qty 1

## 2020-08-12 MED ORDER — ONDANSETRON HCL 4 MG/2ML IJ SOLN: 4.0000 mg | Freq: Four times a day (QID) | INTRAMUSCULAR | Status: DC | PRN

## 2020-08-12 MED ORDER — DOCUSATE SODIUM 100 MG PO CAPS
100.0000 mg | ORAL_CAPSULE | Freq: Two times a day (BID) | ORAL | Status: DC
  Administered 2020-08-12 – 2020-08-21 (×18): 100 mg via ORAL
  Filled 2020-08-12 (×18): qty 1

## 2020-08-12 MED ORDER — METHOCARBAMOL 500 MG PO TABS
1000.0000 mg | ORAL_TABLET | Freq: Three times a day (TID) | ORAL | Status: DC
  Administered 2020-08-12 – 2020-08-13 (×2): 1000 mg via ORAL
  Filled 2020-08-12 (×2): qty 2
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
## 2020-08-12 NOTE — Plan of Care (Signed)
  Problem: Education: Goal: Knowledge of General Education information will improve Description: Including pain rating scale, medication(s)/side effects and non-pharmacologic comfort measures Outcome: Completed/Met   Problem: Health Behavior/Discharge Planning: Goal: Ability to manage health-related needs will improve Outcome: Completed/Met   Problem: Clinical Measurements: Goal: Ability to maintain clinical measurements within normal limits will improve Outcome: Completed/Met Goal: Will remain free from infection Outcome: Completed/Met Goal: Diagnostic test results will improve Outcome: Completed/Met Goal: Respiratory complications will improve Outcome: Completed/Met Goal: Cardiovascular complication will be avoided Outcome: Completed/Met   Problem: Activity: Goal: Risk for activity intolerance will decrease Outcome: Completed/Met   Problem: Nutrition: Goal: Adequate nutrition will be maintained Outcome: Completed/Met   Problem: Coping: Goal: Level of anxiety will decrease Outcome: Completed/Met   Problem: Elimination: Goal: Will not experience complications related to bowel motility Outcome: Completed/Met Goal: Will not experience complications related to urinary retention Outcome: Completed/Met   Problem: Pain Managment: Goal: General experience of comfort will improve Outcome: Completed/Met   Problem: Safety: Goal: Ability to remain free from injury will improve Outcome: Completed/Met   Problem: Skin Integrity: Goal: Risk for impaired skin integrity will decrease Outcome: Completed/Met   Problem: Acute Rehab PT Goals(only PT should resolve) Goal: Pt will Roll Supine to Side Outcome: Completed/Met Goal: Pt Will Go Supine/Side To Sit Outcome: Completed/Met Goal: Patient Will Transfer Sit To/From Stand Outcome: Completed/Met Goal: Pt Will Transfer Bed To Chair/Chair To Bed Outcome: Completed/Met Goal: Pt Will Ambulate Outcome: Completed/Met Goal: Pt Will  Go Up/Down Stairs Outcome: Completed/Met   Problem: Acute Rehab OT Goals (only OT should resolve) Goal: Pt. Will Perform Grooming Outcome: Completed/Met Goal: Pt. Will Perform Lower Body Bathing Outcome: Completed/Met Goal: Pt. Will Perform Upper Body Dressing Outcome: Completed/Met Goal: Pt. Will Perform Lower Body Dressing Outcome: Completed/Met Goal: Pt. Will Transfer To Toilet Outcome: Completed/Met Goal: Pt. Will Perform Toileting-Clothing Manipulation Outcome: Completed/Met Goal: Pt/Caregiver Will Perform Home Exercise Program Outcome: Completed/Met   Report called to Ed, RN, on CIR unit at [facility].  Discussed with patient routine on re-hab and what to expect.
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
## 2020-08-12 NOTE — Progress Notes (Signed)
Patient admitted from [facility]. Patient appears alert and has asked for pain medication when available. Patient stated that he is allergic to tomatoes and tomato products.

## 2020-08-12 NOTE — Progress Notes (Signed)
Inpatient Rehabilitation Medication Review by a Pharmacist  A complete drug regimen review was completed for this patient to identify any potential clinically significant medication issues.  Clinically significant medication issues were identified:  no  Check AMION for pharmacist assigned to patient if future medication questions/issues arise during this admission.  Pharmacist comments:   Time spent performing this drug regimen review (minutes):  5   Fredonia Highland, PharmD, BCPS Clinical Pharmacist Please check AMION for all [facility] Pharmacy numbers 08/12/2020

## 2020-08-12 NOTE — Progress Notes (Signed)
Patient ID: Steve [name], male   DOB: [birth year], 24 y.o.   MRN: [id number] Patient seen and examined this AM.  We discussed all issues.  I adjusted his splint.  He has good early flexion extension.  He has no signs of instability infection or dystrophy.  I discussed all issues with him at length and the findings.  At present time I would recommend consideration for continued range of motion.  We will progress him to a cast in the next 7 to 10 days and continue conservative treatment of the fracture and observation of the median nerve neuropraxia.  Patient I had a long conversation at bedside about these issues and other findings.  I discussed with him the prognosis plans and other issues germane to his predicament. He is certainly stable to transition in my opinion to rehab versus outpatient observatory care.  Given his foot drop issues he may require extended therapeutic endeavors and AFO brace etc.   Madline Oesterling MD
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
## 2020-08-12 NOTE — Progress Notes (Addendum)
[facility]  Subjective  - Still with numbness in the left hand and left foot.   Objective 129/63 67 97.6 F (36.4 C) (Oral) 19 100%  Intake/Output Summary (Last 24 hours) at 08/12/2020 0733 Last data filed at 08/11/2020 2300 Gross per 24 hour  Intake 237 ml  Output 1350 ml  Net -1113 ml   Palpable radial artery left UE and palpable DP left foot. Sensation decreased left hand, and left foot.  No toe or ankle motor intact left LE.   Left hand motor intact, elevated in foam splint. Left LE incision healing well  Assessment/Planning: POD # 6  1.  Left upper arm brachial artery and sheath exploration 2.  Ligation of left brachial vein 3.  Left upper extremity brachial artery thromboembolectomy with 3 Fogarty 4.  Harvest left greater saphenous vein 5.  Left brachial artery to brachial artery interposition bypass with reversed greater saphenous vein  Comminuted fracture of the radius proximally followed by Dr. Amanda Pea.    Well perfused left UE & LE with palpable pulses. Stable disposition from a vascular point of view.   Mosetta Pigeon 08/12/2020 7:33 AM --  Laboratory Lab Results: Recent Labs    08/11/20 0405 08/12/20 0322  WBC 9.9 10.7*  HGB 10.3* 10.4*  HCT 32.4* 32.5*  PLT 181 162   BMET Recent Labs    08/11/20 0405 08/12/20 0322  NA 138 137  K 3.9 4.4  CL 103 103  CO2 25 27  GLUCOSE 85 110*  BUN 6 8  CREATININE 0.68 0.80  CALCIUM 9.0 8.7*    COAG Lab Results  Component Value Date   INR 1.1 08/08/2020   No results found for: PTT  I have independently interviewed and examined patient and agree with PA assessment and plan above.  Okay for DC from vascular standpoint.  We will have him follow-up and 3 to 4 weeks with left upper extremity duplex.  Taylor Levick C. Randie Heinz, MD [facility] [place] Office: [phone] Pager: [phone]
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
## 2020-08-12 NOTE — Progress Notes (Signed)
Inpatient Rehabilitation Admissions Coordinator  I met with patient at bedside to offer Cir admit and patient is in agreement. I have alerted acute team, Trauma team and TOC. I will make the arrangements to admit today.  Danne Baxter, RN, MSN Rehab Admissions Coordinator [phone] 08/12/2020 10:51 AM

## 2020-08-13 ENCOUNTER — Inpatient Hospital Stay (HOSPITAL_COMMUNITY): Payer: Self-pay | Admitting: Occupational Therapy

## 2020-08-13 ENCOUNTER — Inpatient Hospital Stay (HOSPITAL_COMMUNITY): Payer: Self-pay

## 2020-08-13 ENCOUNTER — Inpatient Hospital Stay (HOSPITAL_COMMUNITY): Payer: BC Managed Care – PPO

## 2020-08-13 ENCOUNTER — Inpatient Hospital Stay (HOSPITAL_COMMUNITY): Payer: Self-pay | Admitting: Physical Therapy

## 2020-08-13 DIAGNOSIS — R609 Edema, unspecified: Secondary | ICD-10-CM

## 2020-08-13 DIAGNOSIS — W3400XA Accidental discharge from unspecified firearms or gun, initial encounter: Secondary | ICD-10-CM

## 2020-08-13 DIAGNOSIS — R52 Pain, unspecified: Secondary | ICD-10-CM

## 2020-08-13 DIAGNOSIS — K5903 Drug induced constipation: Secondary | ICD-10-CM

## 2020-08-13 DIAGNOSIS — T07XXXA Unspecified multiple injuries, initial encounter: Secondary | ICD-10-CM

## 2020-08-13 DIAGNOSIS — D62 Acute posthemorrhagic anemia: Secondary | ICD-10-CM

## 2020-08-13 DIAGNOSIS — M7989 Other specified soft tissue disorders: Secondary | ICD-10-CM

## 2020-08-13 DIAGNOSIS — G8918 Other acute postprocedural pain: Secondary | ICD-10-CM

## 2020-08-13 DIAGNOSIS — R7401 Elevation of levels of liver transaminase levels: Secondary | ICD-10-CM

## 2020-08-13 LAB — CBC WITH DIFFERENTIAL/PLATELET
Abs Immature Granulocytes: 0.13 10*3/uL — ABNORMAL HIGH (ref 0.00–0.07)
Basophils Absolute: 0 10*3/uL (ref 0.0–0.1)
Basophils Relative: 0 %
Eosinophils Absolute: 0.2 10*3/uL (ref 0.0–0.5)
Eosinophils Relative: 2 %
HCT: 32.1 % — ABNORMAL LOW (ref 39.0–52.0)
Hemoglobin: 10.1 g/dL — ABNORMAL LOW (ref 13.0–17.0)
Immature Granulocytes: 1 %
Lymphocytes Relative: 18 %
Lymphs Abs: 1.8 10*3/uL (ref 0.7–4.0)
MCH: 26.9 pg (ref 26.0–34.0)
MCHC: 31.5 g/dL (ref 30.0–36.0)
MCV: 85.6 fL (ref 80.0–100.0)
Monocytes Absolute: 0.9 10*3/uL (ref 0.1–1.0)
Monocytes Relative: 9 %
Neutro Abs: 6.9 10*3/uL (ref 1.7–7.7)
Neutrophils Relative %: 70 %
Platelets: 214 10*3/uL (ref 150–400)
RBC: 3.75 MIL/uL — ABNORMAL LOW (ref 4.22–5.81)
RDW: 13.2 % (ref 11.5–15.5)
WBC: 10 10*3/uL (ref 4.0–10.5)
nRBC: 0 % (ref 0.0–0.2)

## 2020-08-13 LAB — COMPREHENSIVE METABOLIC PANEL
ALT: 75 U/L — ABNORMAL HIGH (ref 0–44)
AST: 43 U/L — ABNORMAL HIGH (ref 15–41)
Albumin: 3.2 g/dL — ABNORMAL LOW (ref 3.5–5.0)
Alkaline Phosphatase: 56 U/L (ref 38–126)
Anion gap: 10 (ref 5–15)
BUN: 9 mg/dL (ref 6–20)
CO2: 25 mmol/L (ref 22–32)
Calcium: 9.1 mg/dL (ref 8.9–10.3)
Chloride: 102 mmol/L (ref 98–111)
Creatinine, Ser: 0.63 mg/dL (ref 0.61–1.24)
GFR, Estimated: >60 mL/min (ref >60)
Glucose, Bld: 107 mg/dL — ABNORMAL HIGH (ref 70–99)
Potassium: 3.8 mmol/L (ref 3.5–5.1)
Sodium: 137 mmol/L (ref 135–145)
Total Bilirubin: 0.9 mg/dL (ref 0.3–1.2)
Total Protein: 6.6 g/dL (ref 6.5–8.1)

## 2020-08-13 MED ORDER — GABAPENTIN 300 MG PO CAPS
900.0000 mg | ORAL_CAPSULE | Freq: Three times a day (TID) | ORAL | Status: DC
  Administered 2020-08-13 – 2020-08-20 (×21): 900 mg via ORAL
  Filled 2020-08-13 (×22): qty 3

## 2020-08-13 MED ORDER — METHOCARBAMOL 500 MG PO TABS
1000.0000 mg | ORAL_TABLET | Freq: Four times a day (QID) | ORAL | Status: DC
  Administered 2020-08-13 – 2020-08-21 (×32): 1000 mg via ORAL
  Filled 2020-08-13 (×32): qty 2

## 2020-08-13 NOTE — Progress Notes (Signed)
Bilateral Lower Ext. study completed.   See CVProc for preliminary results.   Carlyn Mullenbach, RDMS, RVT 
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
## 2020-08-13 NOTE — Progress Notes (Signed)
Inpatient Rehabilitation Center Individual Statement of Services  Patient Name:  Steve [name]  Date:  08/13/2020  Welcome to the Inpatient Rehabilitation Center.  Our goal is to provide you with an individualized program based on your diagnosis and situation, designed to meet your specific needs.  With this comprehensive rehabilitation program, you will be expected to participate in at least 3 hours of rehabilitation therapies Monday-Friday, with modified therapy programming on the weekends.  Your rehabilitation program will include the following services:  Physical Therapy (PT), Occupational Therapy (OT), 24 hour per day rehabilitation nursing, Therapeutic Recreaction (TR), Neuropsychology, Care Coordinator, Rehabilitation Medicine, Nutrition Services and Pharmacy Services  Weekly team conferences will be held on Wednesday to discuss your progress.  Your Inpatient Rehabilitation Care Coordinator will talk with you frequently to get your input and to update you on team discussions.  Team conferences with you and your family in attendance may also be held.  Expected length of stay: 10-12 days Overall anticipated outcome: supervision with cueing  Depending on your progress and recovery, your program may change. Your Inpatient Rehabilitation Care Coordinator will coordinate services and will keep you informed of any changes. Your Inpatient Rehabilitation Care Coordinator's name and contact numbers are listed  below.  The following services may also be recommended but are not provided by the Inpatient Rehabilitation Center:   Driving Evaluations  Home Health Rehabiltiation Services  Outpatient Rehabilitation Services  Vocational Rehabilitation   Arrangements will be made to provide these services after discharge if needed.  Arrangements include referral to agencies that provide these services.  Your insurance has been verified to be:  Uninsured-Medicaid pending Your primary doctor is:   None  Pertinent information will be shared with your doctor and your insurance company.  Inpatient Rehabilitation Care Coordinator:  Dossie Der, [place] [phone] or (Luna [place]  Information discussed with and copy given to patient by: Lucy Chris, 08/13/2020, 11:12 AM

## 2020-08-13 NOTE — Progress Notes (Signed)
Inpatient Rehabilitation  Patient information reviewed and entered into eRehab system by Arthur Aydelotte M. Amarise Lillo, M.A., CCC/SLP, PPS Coordinator.  Information including medical coding, functional ability and quality indicators will be reviewed and updated through discharge.    

## 2020-08-14 ENCOUNTER — Inpatient Hospital Stay (HOSPITAL_COMMUNITY): Payer: Self-pay

## 2020-08-14 ENCOUNTER — Inpatient Hospital Stay (HOSPITAL_COMMUNITY): Payer: Self-pay | Admitting: Occupational Therapy

## 2020-08-14 DIAGNOSIS — R030 Elevated blood-pressure reading, without diagnosis of hypertension: Secondary | ICD-10-CM

## 2020-08-14 MED ORDER — MORPHINE SULFATE ER 15 MG PO TBCR
15.0000 mg | EXTENDED_RELEASE_TABLET | Freq: Every day | ORAL | Status: DC
  Administered 2020-08-14 – 2020-08-18 (×5): 15 mg via ORAL
  Filled 2020-08-14 (×5): qty 1

## 2020-08-14 NOTE — Progress Notes (Signed)
Physical Therapy Session Note  Patient Details  Name: Steve [name] MRN: [id number] Date of Birth: 06/13/1996  Today's Date: 08/14/2020 PT Individual Time: 8657-8469 and 1001-1056 PT Individual Time Calculation (min): 54 min and 55 min  Short Term Goals: Week 1:  PT Short Term Goal 1 (Week 1): Pt will transfer stand<>pivot with LRAD and CGA PT Short Term Goal 2 (Week 1): Pt will ambulate 81ft with LRAD min A PT Short Term Goal 3 (Week 1): Pt will perform simulated car transfer with LRAD and CGA  Skilled Therapeutic Interventions/Progress Updates:   Treatment Session 1: 6295-2841 54 min Received pt supine in bed, pt agreeable to therapy, and reported pain 10/10 in L UE (premedicated). Repositioning and rest breaks given to reduce pain levels. Pt reported he did not sleep last night due to high pain levels and requested to speak with MD. Session with emphasis on functional mobility/transfers, generalized strengthening, dynamic standing balance/coordination, ambulation, and improved activity tolerance. Pt with good adherence to L UE NWB precautions throughout session. Pt transferred supine<>sitting EOB with HOB elevated and supervision and transferred bed<>WC stand<>pivot without AD and min A with encouragement to weight bear through L LE. Pt brushed teeth sitting in WC at sink with supervision. Pt transported to therapy gym in [facility] total A for time management purposes and ambulated 68ft with hemi walker and min A. Pt avoiding weight bearing onto L LE due to reports of increased pain, resulting in hopping on R LE. MD present for morning rounds to discuss pt's pain levels and medications. Pt transferred sit<>stand with hemi walker and CGA and attempted alternating toe taps to 3in step x 4 reps however pt with increased difficulty weight shifting to L LE. Instead switched to lateral weight shifting with hemi walker x10 reps and min A and pt able to to put weight onto L LE. RN present to administer  medication and pt later reported pain decrease to 8/10. Returned to alternating toe taps and pt demonstrated improvements weight shifting onto L LE to perform x 16 reps of alternating toe taps to 3in step. Pt required multiple rest/water breaks throughout session demonstrating increased diaphoresis. Stand<>pivot with hemi walker and min A x 2 additional trials throughout session. Pt transported back to room in [facility] total A and transferred sit<>supine with supervision. Concluded session with pt supine in bed, needs within reach, and bed alarm on. Therapist provided fresh water for pt.   Treatment Session 2: 1001-1056 55 min  Received pt supine in bed, pt agreeable to therapy, and reported pain 8/10 in L UE (premedicated). RN present to administer medication during session. Repositioning and rest breaks given to reduce pain levels. Session with emphasis on functional mobility/transfers, generalized strengthening, dynamic standing balance/coordination, ambulation, and improved activity tolerance. Pt performed bed mobility with supervision x 2 trials throughout session and transferred bed<>WC stand<>pivot without AD and CGA x 2 trials. Pt performed WC mobility 148ft using R UE and R LE to dayroom with supervision and cues for propulsion technique. Stand<>pivot WC<>mat without AD CGA. Therapist performed passive ROM into ankle PF/DF/inversion/eversion and passive hamstring stretch for 1 minute to promote ROM. Pt ambulated 40ft x 2 trials using hemi walker and min A with cues to weight shift onto L LE; pt continues to hop on R LE due to fear of trusting L LE. Worked on dynamic standing balance and weight shifting onto L LE playing cornhole x 4 trials without AD and CGA for balance with therapist supporting L UE. Pt became  emotional during session thinking about his young daughters and how much he misses them. Therapist provided emotional support and encouragement. Stand<>pivot mat<>WC without AD and CGA. Pt transported  back to room in [facility] total A. Concluded session with pt supine in bed, needs within reach, and bed alarm on. Therapist assisted with positioning L UE for comfort.   Therapy Documentation Precautions:  Precautions Precautions: Fall Precaution Comments: L foot numbness Restrictions Weight Bearing Restrictions: Yes LUE Weight Bearing: Non weight bearing LLE Weight Bearing: Weight bearing as tolerated   Therapy/Group: Individual Therapy Martin Majestic PT, DPT   08/14/2020, 7:22 AM

## 2020-08-14 NOTE — Progress Notes (Signed)
Occupational Therapy Session Note  Patient Details  Name: Steve [name] MRN: [id number] Date of Birth: 06-Nov-1995  Today's Date: 08/14/2020 OT Individual Time: 1300-1400 OT Individual Time Calculation (min): 60 min    Short Term Goals: Week 1:  OT Short Term Goal 1 (Week 1): STGs equal to LTGs set at supervision to modified independent level overall.  Skilled Therapeutic Interventions/Progress Updates:    Treatment session with focus on functional transfers, sit <> stand, dynamic standing balance, and modified techniques for bathing and dressing due to LUE splinting/NWB.  Pt received semi-reclined in bed expressing desire to wash up due to excessive sweating during previous therapy sessions.  Pt completed bed mobility supervision and completed stand pivot transfer to w/c CGA without AD.  Engaged in bathing at sit > stand level with CGA when standing to wash buttocks and perineal area.  Therapist providing min cues to WB through LLE as tolerated during dynamic standing for bathing and LB dressing.  Therapist provided assistance at LUE due to reports of pain when not supported.  Therapist asked PA if there was an option for support when upright and ambulating due to pain in dependent position - PA to reach out to surgeon.  Pt reports feelings of depression when in hospital room all the time, therefore engaged in dependent w/c mobility around unit and to large windows for emotional support.  Pt returned to room and transferred back to bed CGA without AD.  RN arrived at end of session to provide pain meds and pt reporting fatigue.  Pt remained semi-reclined in bed with LUE elevated on pillows to comfort with all needs in reach.  Therapy Documentation Precautions:  Precautions Precautions: Fall Precaution Comments: L foot numbness Restrictions Weight Bearing Restrictions: Yes LUE Weight Bearing: Non weight bearing LLE Weight Bearing: Weight bearing as tolerated Pain: Pain Assessment Pain  Scale: 0-10 Pain Score: 8  Pain Type: Acute pain Pain Location: Arm Pain Orientation: Left Pain Descriptors / Indicators: Tingling;Throbbing Pain Frequency: Constant Pain Onset: On-going Patients Stated Pain Goal: 4 Pain Intervention(s): Medication (See eMAR) (tylenol)   Therapy/Group: Individual Therapy  Rosalio Loud 08/14/2020, 2:15 PM
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
## 2020-08-14 NOTE — Progress Notes (Signed)
Orthopedic Tech Progress Note Patient Details:  Maksim Peregoy Jan 24, 1996 [id number]  Ortho Devices Type of Ortho Device: Prafo boot/shoe Ortho Device/Splint Location: LLE Ortho Device/Splint Interventions: Application, Adjustment   Post Interventions Patient Tolerated: Well Instructions Provided: Adjustment of device   Marilu Rylander E Sinjin Amero 08/14/2020, 8:27 PM

## 2020-08-14 NOTE — Progress Notes (Signed)
Patient ID: Steve [name], male   DOB: 25-Feb-1996, 24 y.o.   MRN: [id number] Spoke with Mom who was asking if pt had medicaid yet, due to getting bills. Placed a call to [employer] assist who is working on [employer] and asked her to call Mom. She said she would and discuss medicaid status. Pt's young daughter's receive medicaid he should be eligible also.
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
## 2020-08-15 NOTE — Progress Notes (Signed)
Physical Therapy Session Note  Patient Details  Name: Steve [name] MRN: [id number] Date of Birth: August 29, 1996  Today's Date: 08/15/2020 PT Individual Time: 0810-0922 PT Individual Time Calculation (min): 72 min   Short Term Goals: Week 1:  PT Short Term Goal 1 (Week 1): Pt will transfer stand<>pivot with LRAD and CGA PT Short Term Goal 2 (Week 1): Pt will ambulate 4ft with LRAD min A PT Short Term Goal 3 (Week 1): Pt will perform simulated car transfer with LRAD and CGA  Skilled Therapeutic Interventions/Progress Updates:  Pt lying in bed.  He rated pain 7/10 LUE, premedicated.  Therapeutic exercise performed with LE to increase strength for functional mobility.: 10 x 1 R straight leg raises, 25 x 2 R ankle circles, L quad sets,   PROM L ankle to tolerance. Use of Kinetron at resistance 30 cm/sec from w/c level, x 5 minutes, focusing on neutral L hip rotation, L foot flat on footplate, and LLE activation.  PT instructed pt in diaphragmatic breathing and relaxation imagery for pain mgt, with good carryover.  Supine> sit in flat bed with supervision.  Stand pivot with use of HW, to L, min assist for PT to hold weight of LUE.   Gait training on level tile, x 34' with HW, min assist for PT to support LUE.  X 24' iwht ACE as LUE sling, CGA.  Max cues for step lengths, placement of HW.  At end of session, pt seated in w/c at his request, with seat belt alarm set and needs at hand.  R footrest removed so that pt could propel in room.  PT stressed importance of pt asking for help before getting back to bed; he voiced understanding.       Therapy Documentation Precautions:  Precautions Precautions: Fall Precaution Comments: L foot numbness Restrictions Weight Bearing Restrictions: Yes LUE Weight Bearing: Non weight bearing LLE Weight Bearing: Weight bearing as tolerated  Pain: Pain Assessment Pain Scale: 0-10 Pain Score: 7  Pain Type: Acute pain Pain Location: Arm Pain  Orientation: Left Pain Descriptors / Indicators: Aching;Throbbing Pain Frequency: Constant Pain Onset: On-going Patients Stated Pain Goal: 4 Pain Intervention(s): Medication (See eMAR) (gabapentin, robaxin)     Therapy/Group: Individual Therapy  Earle Burson 08/15/2020, 9:47 AM

## 2020-08-16 ENCOUNTER — Inpatient Hospital Stay (HOSPITAL_COMMUNITY): Payer: Self-pay | Admitting: Occupational Therapy

## 2020-08-16 ENCOUNTER — Inpatient Hospital Stay (HOSPITAL_COMMUNITY): Payer: Self-pay

## 2020-08-16 NOTE — Progress Notes (Signed)
Occupational Therapy Session Note  Patient Details  Name: Steve [name] MRN: [id number] Date of Birth: Feb 24, 1996  Today's Date: 08/16/2020 OT Individual Time: 7616-0737 and 1400-1528 OT Individual Time Calculation (min): 27 min and 88 min  Short Term Goals: Week 1:  OT Short Term Goal 1 (Week 1): STGs equal to LTGs set at supervision to modified independent level overall.  Skilled Therapeutic Interventions/Progress Updates:    Pt greeted in bed, stated his pain medicine was due near end of session and that he would remind RN via call bell. Started session with donning shirt EOB with setup assistance. Also donned his makeshift sling for the Lt UE, pt needing assistance to don. He will need an actual sling ordered, RN made aware. Pt completed short distance ambulatory transfer to the w/c parked near sink using hemi walker with Min Steve. Vcs for increase Lt LE weightbearing in stance. OT set up bed per bed height at home (fairly high), flat without bedrails as well. Pt practiced transferring in and out of bed, needing setup assistance, visibly mindful of his Lt UE NWB precautions. At end of session pt returned to bed and was left with all needs within reach and bed alarm set.   2nd Session 1:1 tx (88 min) Pt greeted in bed and premedicated for pain, requesting to engage in bathing/dressing tasks during session. Short distance ambulatory transfer completed to w/c using hemi walker with Min Steve for keeping Lt UE elevated (OT created makeshift sling as Steve real sling was still not present in the room). Pt completed UB/LB B/D sit<stand at the sink, vcs for lifting the Rt armrest to increase ease of completing perihygiene in the front while seated. Min Steve for dynamic standing balance as pt tended to offload the Lt side and lean heavily towards Rt due to sensation deficits in the Lt LE. He needed assistance to wash/dress the Lt foot and also to thread the Lt arm into his shirt. Education provided regarding one  handed adaptive strategies during bathing and grooming tasks which pt did well with. OT also washed his hair using the hair washing tray to increase psychosocial health. At end of session pt returned to bed via short distance ambulatory transfer without device and Min Steve. Note that pt appears more stable when using the hemi walker but he wanted to try transferring without it. Pt transferred to bed and was assisted with repositioning for comfort. Left him with all needs within reach and bed alarm set.    Therapy Documentation Precautions:  Precautions Precautions: Fall Precaution Comments: L foot numbness Restrictions Weight Bearing Restrictions: Yes LUE Weight Bearing: Non weight bearing LLE Weight Bearing: Weight bearing as tolerated Pain: Pain Assessment Pain Scale: 0-10 Pain Score: 5  Pain Type: Acute pain Pain Location: Arm Pain Orientation: Left Pain Descriptors / Indicators: Throbbing;Aching Pain Frequency: Constant Pain Onset: On-going Patients Stated Pain Goal: 4 Pain Intervention(s): Medication (See eMAR) (oxycodone) ADL: ADL Eating: Set up Where Assessed-Eating: Chair Grooming: Supervision/safety Where Assessed-Grooming: Edge of bed Upper Body Bathing: Minimal assistance Where Assessed-Upper Body Bathing: Edge of bed Lower Body Bathing: Minimal assistance Where Assessed-Lower Body Bathing: Edge of bed Upper Body Dressing: Supervision/safety Where Assessed-Upper Body Dressing: Edge of bed Lower Body Dressing: Minimal assistance Where Assessed-Lower Body Dressing: Edge of bed Toileting: Minimal assistance Where Assessed-Toileting: Bedside Commode Toilet Transfer: Minimal assistance Toilet Transfer Method: Stand pivot Acupuncturist: Animator Transfer: Not assessed Film/video editor: Not assessed      Therapy/Group: Individual  Therapy  Steve [name] Steve [name] 08/16/2020, 12:47 PM

## 2020-08-17 ENCOUNTER — Inpatient Hospital Stay (HOSPITAL_COMMUNITY): Payer: Self-pay

## 2020-08-17 ENCOUNTER — Encounter (HOSPITAL_COMMUNITY): Payer: Self-pay | Admitting: Psychology

## 2020-08-17 ENCOUNTER — Inpatient Hospital Stay (HOSPITAL_COMMUNITY): Payer: Self-pay | Admitting: Occupational Therapy

## 2020-08-17 DIAGNOSIS — R030 Elevated blood-pressure reading, without diagnosis of hypertension: Secondary | ICD-10-CM

## 2020-08-17 DIAGNOSIS — F4311 Post-traumatic stress disorder, acute: Secondary | ICD-10-CM

## 2020-08-17 DIAGNOSIS — E46 Unspecified protein-calorie malnutrition: Secondary | ICD-10-CM

## 2020-08-17 DIAGNOSIS — E8809 Other disorders of plasma-protein metabolism, not elsewhere classified: Secondary | ICD-10-CM

## 2020-08-17 LAB — COMPREHENSIVE METABOLIC PANEL
ALT: 59 U/L — ABNORMAL HIGH (ref 0–44)
AST: 26 U/L (ref 15–41)
Albumin: 3.1 g/dL — ABNORMAL LOW (ref 3.5–5.0)
Alkaline Phosphatase: 57 U/L (ref 38–126)
Anion gap: 8 (ref 5–15)
BUN: 9 mg/dL (ref 6–20)
CO2: 25 mmol/L (ref 22–32)
Calcium: 9.1 mg/dL (ref 8.9–10.3)
Chloride: 105 mmol/L (ref 98–111)
Creatinine, Ser: 0.67 mg/dL (ref 0.61–1.24)
GFR, Estimated: >60 mL/min (ref >60)
Glucose, Bld: 102 mg/dL — ABNORMAL HIGH (ref 70–99)
Potassium: 4 mmol/L (ref 3.5–5.1)
Sodium: 138 mmol/L (ref 135–145)
Total Bilirubin: 0.5 mg/dL (ref 0.3–1.2)
Total Protein: 6.3 g/dL — ABNORMAL LOW (ref 6.5–8.1)

## 2020-08-17 MED ORDER — PROSOURCE PLUS PO LIQD
30.0000 mL | Freq: Two times a day (BID) | ORAL | Status: DC
  Administered 2020-08-18 – 2020-08-21 (×7): 30 mL via ORAL
  Filled 2020-08-17 (×7): qty 30
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
## 2020-08-17 NOTE — Progress Notes (Signed)
Occupational Therapy Session Note  Patient Details  Name: Steve [name] MRN: [id number] Date of Birth: 1996-09-07  Today's Date: 08/17/2020 OT Individual Time: 0848-1000 OT Individual Time Calculation (min): 72 min    Short Term Goals: Week 1:  OT Short Term Goal 1 (Week 1): STGs equal to LTGs set at supervision to modified independent level overall.  Skilled Therapeutic Interventions/Progress Updates:    Treatment session with focus on self-care retraining, dynamic standing balance, tolerance of increased weight bearing through LLE, and positioning/support of LUE during self-care tasks and mobility.  Pt received supine in bed with reports of new sensation in Lt foot, pt pleased as he reports MD informed him that it is sensation returning.  Pt able to demonstrate slight ankle dorsiflexion and big toe movement in bed.  Pt demonstrating increased sensation along underside and foot and toes.  Pt completed bed mobility and stand pivot transfer to w/c with supervision without AD.  Engaged in bathing at sit > stand level at sink.  Pt demonstrating improved weight bearing through LLE in standing, also with decreased lean to Rt during dynamic standing.  Therapist providing CGA during LB bathing when standing and cues for sequencing with LB dressing. Pt donned shirt with setup assist and therapist donned sling to LUE to provide increased support.  Pt ambulated 80' with hemi-walker with min cues for sequencing stepping pattern with hemi-walker and improved upright posture.  Pt returned to sitting EOB and left with nurse tech present to weigh pt.  Therapy Documentation Precautions:  Precautions Precautions: Fall Precaution Comments: L foot numbness Restrictions Weight Bearing Restrictions: Yes LUE Weight Bearing: Non weight bearing LLE Weight Bearing: Weight bearing as tolerated Pain: Pain Assessment Pain Scale: 0-10 Pain Score: 7  Pain Type: Surgical pain Pain Location: Leg Pain Orientation:  Left Pain Descriptors / Indicators: Aching Pain Frequency: Constant Pain Onset: On-going Patients Stated Pain Goal: 3 Pain Intervention(s): Medication (See eMAR)   Therapy/Group: Individual Therapy  Rosalio Loud 08/17/2020, 10:47 AM
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
## 2020-08-17 NOTE — Progress Notes (Signed)
Physical Therapy Session Note  Patient Details  Name: Steve [name] MRN: [id number] Date of Birth: 1995/11/02  Today's Date: 08/17/2020 PT Individual Time: 1100-1159 and 1345-1457 PT Individual Time Calculation (min): 59 min and 72 min  Short Term Goals: Week 1:  PT Short Term Goal 1 (Week 1): Pt will transfer stand<>pivot with LRAD and CGA PT Short Term Goal 2 (Week 1): Pt will ambulate 6ft with LRAD min A PT Short Term Goal 3 (Week 1): Pt will perform simulated car transfer with LRAD and CGA  Skilled Therapeutic Interventions/Progress Updates:   Treatment Session 1: 1100-1159 59 min Received pt supine in bed, pt agreeable to therapy, and reported pain 7/10 in L UE. RN present to administer medications. Repositioning, rest breaks, and distraction done to reduce pain levels. Session with emphasis on functional mobility/transfers, generalized strengthening, dynamic standing balance/coordination, ambulation, toileting, and improved activity tolerance. Pt performed bed mobility with supervision x 2 trials throughout session. Pt ambulated 43ft x 2 trials to/from bathroom with hemi walker and CGA. Pt able to stand and void with CGA. Pt sat in WC and washed hands with supervision. Pt performed WC mobility >18ft using bilateral LEs and R UE and supervision to ortho gym and ambulated 78ft x 2 trials without AD and CGA with 1 LOB requiring min A to correct to car and performed simulated car transfer without AD and CGA. Pt ambulated >37ft on uneven surfaces (ramp and mulch) and navigated 1 curb with hemi walker and CGA with cues for AD safety and overall technique. Overall, pt demonstrated improved weight shifting to L LE and occasional step through pattern. Pt ambulated additional 53ft with hemi walker CGA to Nustep and performed bilateral LE strengthening on Nustep at workload 6 for 7 minutes for a total of 141 steps for improved cardiovascular endurance. Pt transported back to room in [facility] total A and  transferred WC<>bed without AD and CGA. Concluded session with pt supine in bed, needs within reach, and bed alarm on.   Treatment Session 2: 1345-1457 72 min Received pt supine in bed, pt agreeable to therapy, and reported pain 6/10 in L UE. RN present to administer medications. Repositioning, rest breaks, and distraction done to reduce pain levels. Session with emphasis on functional mobility/transfers, generalized strengthening, dynamic standing balance/coordination, gait training, and improved activity tolerance. Pt performed bed mobility with supervision and transferred bed<>WC stand<>pivot without AD and CGA. Pt performed WC mobility 136ft using bilateral LEs and R UE to dayroom and reported urge to urinate. Pt ambulated 13ft x 2 trials with hemi walker and CGA and able to stand and void with CGA. Pt stood at sink and washed hands with supervision. Pt ambulated 29ft x1 trial with hemi walker weaving in/out of cones and 24ft x 1 with SPC and CGA. Pt required verbal cues for gait pattern with AD. Pt ambulated additional 132ft with SPC and CGA with no LOB noted. Pt reporting feeling comfortable using cane. Discussed post discharge therapy options and pt agreeable to OPPT. Pt transferred sit<>stand with SPC and performed alternating heel taps to 3in steps 2x12 with min A for balance with emphasis on promoting L LE DF. However, pt unable to strike with R heel and attempting to compensate by squatting down. Pt transported back to room in [facility] total A and ambulated 13ft with SPC CGA to recliner. While sitting in recliner worked on active DF of L LE with pt demonstrating minimal but some voluntary movement. Concluded session with pt in recliner, needs within  reach, and chair pad alarm on. Therapist assisted with positioning L UE on pillows for comfort.   Therapy Documentation Precautions:  Precautions Precautions: Fall Precaution Comments: L foot numbness Restrictions Weight Bearing Restrictions: Yes LUE  Weight Bearing: Non weight bearing LLE Weight Bearing: Weight bearing as tolerated   Therapy/Group: Individual Therapy Martin Majestic PT, DPT   08/17/2020, 7:19 AM

## 2020-08-18 ENCOUNTER — Inpatient Hospital Stay (HOSPITAL_COMMUNITY): Payer: Self-pay | Admitting: Occupational Therapy

## 2020-08-18 ENCOUNTER — Inpatient Hospital Stay (HOSPITAL_COMMUNITY): Payer: Self-pay
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
## 2020-08-18 NOTE — Progress Notes (Signed)
Occupational Therapy Session Note  Patient Details  Name: Steve [name] MRN: [id number] Date of Birth: Sep 23, 1996  Today's Date: 08/18/2020 OT Individual Time: 1100-1157 and 6962-9528 OT Individual Time Calculation (min): 57 min and 70 min   Short Term Goals: Week 1:  OT Short Term Goal 1 (Week 1): STGs equal to LTGs set at supervision to modified independent level overall.  Skilled Therapeutic Interventions/Progress Updates:    1) Treatment session with focus on functional mobility in home environment.  Pt received in recliner reporting mild pain in LLE that increased during therapy session.  RN notified to provide meds at end of session.  Pt completed functional mobility in room with [facility] with CGA to close supervision.  Engaged in functional mobility and bathroom transfers in ADL apt.  Pt completed ambulatory transfers in ADL apt with SPC with CGA.  Educated on use of tub transfer bench for tub/shower transfers.  Pt still stepped over with LLE then sat on bench and slid remainder of way in tub/shower.  Therapist educated on purpose of bench for safety with transfer.  Discussed current MD recommendation of no shower, but may be able to shower if cast and incisions are able to remain dry.  Engaged in functional mobility in ADL apt including furniture transfers with [facility] with close supervision.  Pt ambulated in ADL kitchen with SPC with close supervision while educating on safety with meal prep.  Pt will benefit from Supervision with meal prep due to use of SPC in one hand and LUE NWB and in splint.  Pt able to transfer light item and discussed placing small items in pocket as current recommendation is to use [facility] for ambulation and therefore will not be able to transport items with RUE.  Pt with c/o increased pain in LLE, returned to room and transferred back to recliner.  RN notified of request for pain meds.  2) Treatment session with focus on functional transfers and dynamic standing balance during  self-care retraining tasks.  Pt received in sidelying in bed expressing desire to bathe.  Pt completed bed mobility supervision and stand pivot transfer to w/c without AD with supervision.  Pt completed grooming tasks in sitting without assistance.  Engaged in bathing at sit > stand level with cues for dynamic standing balance and increased WB through LLE as tolerated.  Pt utilized wide seated posture to wash groin and then again in standing with supervision.  Pt continues to be overwhelmed and verbose about nature of injuries with multiple questions about healing and eventual prognosis.  Therapist providing reassurance as able and encourages pt to speak with MD about his questions.  RN arrived during bathing to change dressings to thigh.  Pt completed LB dressing at sit > stand level with min cues for dressing Lt side first for increased independence.  Pt returned to bed to wait for orthopedics to take out sutures and apply new cast to LUE.  Pt remained supine with LUE positioned in elevated position for comfort.  Therapy Documentation Precautions:  Precautions Precautions: Fall Precaution Comments: L foot numbness Restrictions Weight Bearing Restrictions: Yes LUE Weight Bearing: Non weight bearing LLE Weight Bearing: Weight bearing as tolerated Pain: 1) Pain Assessment Pain Scale: 0-10 Pain Score: 7  Pain Location: Arm Pain Orientation: Left Pain Radiating Towards: left leg Pain Descriptors / Indicators: Sharp Pain Intervention(s): Medication (See eMAR)   2) Pt with c/o pain in LLE, neuropathic in nature.  Educated on alternative pain management strategies.   Therapy/Group: Individual Therapy  Rosalio Loud 08/18/2020, 12:37 PM

## 2020-08-18 NOTE — Progress Notes (Signed)
Physical Therapy Session Note  Patient Details  Name: Steve [name] MRN: [id number] Date of Birth: Dec 19, 1995  Today's Date: 08/18/2020 PT Individual Time: 0900-1012 PT Individual Time Calculation (min): 72 min   Short Term Goals: Week 1:  PT Short Term Goal 1 (Week 1): Pt will transfer stand<>pivot with LRAD and CGA PT Short Term Goal 2 (Week 1): Pt will ambulate 3ft with LRAD min A PT Short Term Goal 3 (Week 1): Pt will perform simulated car transfer with LRAD and CGA  Skilled Therapeutic Interventions/Progress Updates:   Received pt supine in bed with RN present administering medications, pt agreeable to therapy, and reported pain 8/10 in L UE (premedicated). Repositioning, rest breaks, and distraction done to reduce pain levels. Session with emphasis on functional mobility/transfers, generalized strengthening, dynamic standing balance/coordination, ambulation, stair navigation, and improved activity tolerance. Pt performed bed mobility with supervision and transferred bed<>WC stand<>pivot without AD and CGA. Pt brushed teeth sitting in WC at sink with supervision. Pt performed WC mobility 161ft using bilateral LEs and R UE with supervision to therapy gym and transferred stand<>pivot without AD and CGA to mat. Pt ambulated 272ft with SPC and CGA with sling on L UE. Pt demonstrated wide BOS, decreased L LE foot clearance and foot drag, and decreased trunk rotation. Noted with increased L foot DF muscle activation since eval. Pt performed the following exercises standing at parallel bars with 1 UE support whenever pt standing on L LE due to weakness, otherwise no UE support: -alternating marching x20 -hip abduction x15 bilaterally -squats 2x10 Pt required multiple rest/water breaks throughout session due to increased fatigue. Pt ambulated additional 10ft x 2 trials to/from staircase with SPC and CGA. Pt navigated 12 steps with 1 HR and CGA ascending with a step through pattern and descending  with a step to pattern with cues for safety. Pt with R LE quad weakness as evidenced by visible muscle trembeling. Pt transported back to room in [facility] total A and ambulated 71ft with SPC and CGA to recliner. Concluded session with pt sitting in recliner, needs within reach, and chair pad alarm on.  Therapy Documentation Precautions:  Precautions Precautions: Fall Precaution Comments: L foot numbness Restrictions Weight Bearing Restrictions: Yes LUE Weight Bearing: Non weight bearing LLE Weight Bearing: Weight bearing as tolerated   Therapy/Group: Individual Therapy Martin Majestic PT, DPT   08/18/2020, 7:21 AM

## 2020-08-18 NOTE — Progress Notes (Signed)
Dr. Amanda Pea removed sutures from pt LUE. Pt tolerated well. No complications noted. Cast applied per ortho. Mylo Red, LPN
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
## 2020-08-19 ENCOUNTER — Inpatient Hospital Stay (HOSPITAL_COMMUNITY): Payer: Self-pay

## 2020-08-19 ENCOUNTER — Inpatient Hospital Stay (HOSPITAL_COMMUNITY): Payer: Self-pay | Admitting: Occupational Therapy

## 2020-08-19 ENCOUNTER — Inpatient Hospital Stay (HOSPITAL_COMMUNITY): Payer: Self-pay | Admitting: *Deleted

## 2020-08-19 DIAGNOSIS — M792 Neuralgia and neuritis, unspecified: Secondary | ICD-10-CM

## 2020-08-19 LAB — CBC
HCT: 31.4 % — ABNORMAL LOW (ref 39.0–52.0)
Hemoglobin: 10 g/dL — ABNORMAL LOW (ref 13.0–17.0)
MCH: 27.9 pg (ref 26.0–34.0)
MCHC: 31.8 g/dL (ref 30.0–36.0)
MCV: 87.5 fL (ref 80.0–100.0)
Platelets: 298 10*3/uL (ref 150–400)
RBC: 3.59 MIL/uL — ABNORMAL LOW (ref 4.22–5.81)
RDW: 13.3 % (ref 11.5–15.5)
WBC: 8.9 10*3/uL (ref 4.0–10.5)
nRBC: 0 % (ref 0.0–0.2)

## 2020-08-19 LAB — CREATININE, SERUM
Creatinine, Ser: 0.69 mg/dL (ref 0.61–1.24)
GFR, Estimated: >60 mL/min (ref >60)

## 2020-08-19 MED ORDER — LIDOCAINE 5 % EX PTCH
1.0000 | MEDICATED_PATCH | CUTANEOUS | Status: DC
  Administered 2020-08-19: 1 via TRANSDERMAL
  Filled 2020-08-19 (×2): qty 1
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
## 2020-08-19 NOTE — Progress Notes (Signed)
Occupational Therapy Discharge Summary  Patient Details  Name: Steve [name] MRN: [id number] Date of Birth: 1996-06-21   Patient has met 10 of 10 long term goals due to improved activity tolerance, improved balance, ability to compensate for deficits and improved awareness.  Patient to discharge at overall Supervision level.  Patient's care partner is independent to provide the necessary intermittent supervision assistance at discharge.    Reasons goals not met: NA  Recommendation:  Patient will benefit from ongoing skilled OT services in outpatient setting to continue to advance functional skills in the area of NMR, ROM, strengthening of LUE after casting removed.  Equipment: tub transfer bench  Reasons for discharge: treatment goals met and discharge from hospital  Patient/family agrees with progress made and goals achieved: Yes  OT Discharge Precautions/Restrictions  Precautions Precautions: Fall Precaution Comments: L foot drop Restrictions Weight Bearing Restrictions: Yes Pain Pain Assessment Pain Scale: 0-10 Pain Score: 4  Pain Type: Surgical pain Pain Location: Hand Pain Orientation: Left Pain Descriptors / Indicators: Burning Pain Intervention(s): Medication (See eMAR) ADL ADL Eating: Independent Where Assessed-Eating: Edge of bed Grooming: Modified independent Where Assessed-Grooming: Sitting at sink Upper Body Bathing: Setup Where Assessed-Upper Body Bathing: Sitting at sink Lower Body Bathing: Supervision/safety Where Assessed-Lower Body Bathing: Sitting at sink, Standing at sink Upper Body Dressing: Independent Where Assessed-Upper Body Dressing: Edge of bed Lower Body Dressing: Supervision/safety Where Assessed-Lower Body Dressing: Sitting at sink, Standing at sink Toileting: Supervision/safety Where Assessed-Toileting: Bedside Commode Toilet Transfer: Close supervision Toilet Transfer Method: Counselling psychologist: Research officer, political party: Not assessed Social research officer, government: Not assessed Vision Baseline Vision/History: No visual deficits Patient Visual Report: No change from baseline Vision Assessment?: No apparent visual deficits Perception  Perception: Within Functional Limits Praxis Praxis: Intact Cognition Overall Cognitive Status: Within Functional Limits for tasks assessed Arousal/Alertness: Awake/alert Attention: Sustained Sustained Attention: Appears intact Memory: Appears intact Awareness: Appears intact Problem Solving: Appears intact Safety/Judgment: Appears intact Sensation Sensation Light Touch: Impaired by gross assessment Light Touch Impaired Details: Impaired RUE Proprioception: Appears Intact Additional Comments: Pt reports pins and needles sensation in L LE. Decreased sensation along L great toe and L lateral malleoli. Coordination Gross Motor Movements are Fluid and Coordinated: No Fine Motor Movements are Fluid and Coordinated: No Coordination and Movement Description: grossly uncoordinated due to L LE foot drop, decreased balance, and generalized weakness Finger Nose Finger Test: WFL on R UE, unable to achieve full ROM on L UE due to pain and cast Heel Shin Test: [facility] bilaterally Motor  Motor Motor: Abnormal postural alignment and control Motor - Skilled Clinical Observations: grossly uncoordinated due to L LE foot drop, generalized weakness and endurance, and decreased balance Mobility  Bed Mobility Bed Mobility: Rolling Right;Rolling Left;Supine to Sit;Sit to Supine Rolling Right: Independent Rolling Left: Independent Supine to Sit: Independent Sit to Supine: Independent Transfers Sit to Stand: Supervision/Verbal cueing Stand to Sit: Supervision/Verbal cueing  Trunk/Postural Assessment  Cervical Assessment Cervical Assessment: Within Functional Limits Thoracic Assessment Thoracic Assessment: Within Functional Limits Lumbar Assessment Lumbar  Assessment: Within Functional Limits Postural Control Postural Control: Deficits on evaluation  Balance Balance Balance Assessed: Yes Static Sitting Balance Static Sitting - Balance Support: Feet supported;No upper extremity supported Static Sitting - Level of Assistance: 7: Independent Dynamic Sitting Balance Dynamic Sitting - Balance Support: Feet supported;No upper extremity supported Dynamic Sitting - Level of Assistance: 7: Independent Static Standing Balance Static Standing - Balance Support: No upper extremity supported Static Standing - Level of Assistance: 6:  Modified independent (Device/Increase time) Dynamic Standing Balance Dynamic Standing - Balance Support: Right upper extremity supported (SPC) Dynamic Standing - Level of Assistance: 5: Stand by assistance (supervision) Extremity/Trunk Assessment RUE Assessment RUE Assessment: Within Functional Limits LUE Assessment LUE Assessment: Exceptions to [facility] Active Range of Motion (AROM) Comments: Shoulder AROM WFLS grossly for flexion.  UE in hard cast from just below the shoulder to the MPs of the hand, so no PROM noted.  He was able to complete digit flexion 75% of normal as well as 80% of full digit extension within the confines of the splint.   Simonne Come 08/19/2020, 12:23 PM

## 2020-08-19 NOTE — Progress Notes (Signed)
Physical Therapy Session Note  Patient Details  Name: Steve [name] MRN: [id number] Date of Birth: 1996-02-05  Today's Date: 08/19/2020 PT Individual Time: 9163-8466 and [id number]  PT Individual Time Calculation (min): 55 min and 71 min  Short Term Goals: Week 1:  PT Short Term Goal 1 (Week 1): Pt will transfer stand<>pivot with LRAD and CGA PT Short Term Goal 2 (Week 1): Pt will ambulate 98ft with LRAD min A PT Short Term Goal 3 (Week 1): Pt will perform simulated car transfer with LRAD and CGA  Skilled Therapeutic Interventions/Progress Updates:   Treatment Session 1: 0800-0855 55 min Received pt supine in bed, pt agreeable to therapy, and reported pain 3/10 in L UE (premedicated). Repositioning, rest breaks, and distraction done to reduce pain levels. Session with emphasis on discharge planning, functional mobility/transfers, generalized strengthening, dynamic standing balance/coordinaiton, ambulation, simulated car transfers, stair navigation, and improved activity tolerance. Pt performed bed mobility with supervision and transferred bed<>WC stand<>pivot without AD and close supervision. Pt transported to ortho gym in [facility] total A for time management purposes and performed ambulatory car transfer with [facility] and close supervision/CGA with cues for safe entry into car as pt continuing to attempt to side step in. Pt ambulated 44ft on uneven surfaces (ramp and mulch) with SPC and close supervision and navigated 1 curb with SPC and close supervision with verbal cues for AD safety. Pt able to stand and pick up tissue box from floor without AD and close supervision. Pt transported to therapy gym in [facility] total A and navigated 12 steps with 1 rail and supervision ascending with a step through and descending with a step to pattern. Pt required cues for "up with the good and down with the bad" stepping sequence and cues for attention to focus on clearing L LE. Pt ambulated 275ft with SPC and close  supervision/CGA with sling on L UE. MD present for morning rounds and pt with multiple questions regarding his wounds and healing timeline. Pt transported back to room in [facility] total A and ambulated 88ft with SPC and supervision to recliner. Concluded session with pt sitting in recliner, needs within reach, and chair pad alarm on.   Treatment Session 2: 1415-1526 71 min Received pt sitting EOB with RN and recreational therapy present, pt agreeable to therapy, and reported pain 3/10 in L UE and 7/10 pain in L foot (premedicated). Repositioning, rest breaks, and distraction done to reduce pain levels. Session with emphasis on functional mobility/transfers, generalized strengthening, dynamic standing balance/coordinaiton, ambulation, and improved activity tolerance. Pt ambulated 169ft x 2 trials to/from therapy gym with SPC, sling on L UE, and close supervision and performed standing bilateral LE strengthening on Kinetron at 15cm/sec for 3 minutes x 2 trials with 1 UE support and supervision. Pt required multiple rest/water breaks throughout session due to increased fatigue and easily distracted talking on the phone. Engaged in [employer] tennis x 6 games with emphasis on dynamic standing balance and coordination without AD and close supervision for approximately ~15 minutes. Pt transferred sit<>stand 3x10 reps without UE support on Airex with close supervision. Concluded session with pt sitting in WC washing up at sink, needs within reach, and chair pad alarm on. NT aware of pt's current status.   Therapy Documentation Precautions:  Precautions Precautions: Fall Precaution Comments: L foot numbness Restrictions Weight Bearing Restrictions: Yes LUE Weight Bearing: Non weight bearing LLE Weight Bearing: Weight bearing as tolerated  Therapy/Group: Individual Therapy Martin Majestic PT, DPT  08/19/2020, 7:19 AM

## 2020-08-19 NOTE — Progress Notes (Signed)
Patient ID: Steve [name], male   DOB: July 07, 1996, 24 y.o.   MRN: [id number]  Met wit pt and spoke with Mom via telephone to discuss team conference goals supervision level and target discharge date of 10/22. Both are pleased with his progress and being able to go home. Agreeable to cane and tub bench and OPPT if can get OP to see him. Will see neuro-psych Friday early prior to discharge home for follow up.
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
## 2020-08-19 NOTE — Progress Notes (Signed)
Occupational Therapy Session Note  Patient Details  Name: Steve [name] MRN: [id number] Date of Birth: 1995-12-23  Today's Date: 08/19/2020 OT Individual Time: 1030-1130 OT Individual Time Calculation (min): 60 min    Short Term Goals: Week 1:  OT Short Term Goal 1 (Week 1): STGs equal to LTGs set at supervision to modified independent level overall.  Skilled Therapeutic Interventions/Progress Updates:    Treatment session with focus on functional mobility, dynamic standing balance, and maintaining NWB through LUE during self-care tasks.  Pt received upright in recliner reporting desire to wash up.  Pt ambulated to sink with SPC with supervision.  Engaged in grooming tasks Mod I and bathing at overall distant supervision/setup.  Pt demonstrating increased safety and awareness with dynamic standing balance during LB bathing and dressing.  RN arrived to change dressing to groin and thigh, therefore pt returned to sidelying with supervision.  Pt completed LB dressing at sit > stand level from EOB with min cues for sequencing.  Pt donned shirt seated EOB with question cues to recall recommendation to don LUE first.  Pt remained seated EOB with all needs in reach.  Therapy Documentation Precautions:  Precautions Precautions: Fall Precaution Comments: L foot drop Restrictions Weight Bearing Restrictions: Yes LUE Weight Bearing: Non weight bearing LLE Weight Bearing: Weight bearing as tolerated Pain: Pain Assessment Pain Scale: 0-10 Pain Score: 4  Pain Type: Surgical pain Pain Location: Hand Pain Orientation: Left Pain Descriptors / Indicators: Burning Pain Intervention(s): Medication (See eMAR)   Therapy/Group: Individual Therapy  Rosalio Loud 08/19/2020, 12:14 PM

## 2020-08-19 NOTE — Progress Notes (Signed)
Physical Therapy Discharge Summary  Patient Details  Name: Steve [name] MRN: [id number] Date of Birth: 1996-03-04  Patient has met 7 of 7 long term goals due to improved activity tolerance, improved balance, improved postural control, increased strength, decreased pain, improved awareness and improved coordination.  Patient to discharge at an ambulatory level Supervision. Pt's family did not attend family education training, however pt is currently at a supervision level overall and has verbalized and demonstrated confidence with all tasks to ensure safe discharge home. Pt will have 24/7 assist as needed at home from brother, mother, and additional family members.   All goals met  Recommendation:  Patient will benefit from ongoing skilled PT services in outpatient setting to continue to advance safe functional mobility, address ongoing impairments in transfers, dynamic standing balance/coordination, ambulation, L LE foot drop, generalized strengthening, endurance, and to minimize fall risk.  Equipment: single point cane  Reasons for discharge: treatment goals met  Patient/family agrees with progress made and goals achieved: Yes  PT Discharge Precautions/Restrictions Precautions Precautions: Fall Precaution Comments: L foot drop Restrictions Weight Bearing Restrictions: Yes LUE Weight Bearing: Non weight bearing LLE Weight Bearing: Weight bearing as tolerated Cognition Overall Cognitive Status: Within Functional Limits for tasks assessed Arousal/Alertness: Awake/alert Orientation Level: Oriented X4 Memory: Appears intact Awareness: Appears intact Problem Solving: Appears intact Safety/Judgment: Appears intact Sensation Sensation Light Touch: Impaired by gross assessment Proprioception: Appears Intact Additional Comments: Pt reports pins and needles sensation in L LE. Decreased sensation along L great toe and L lateral malleoli. Coordination Gross Motor Movements are Fluid  and Coordinated: No Fine Motor Movements are Fluid and Coordinated: No Coordination and Movement Description: grossly uncoordinated due to L LE foot drop, decreased balance, and generalized weakness Finger Nose Finger Test: WFL on R UE, unable to achieve full ROM on L UE due to pain and cast Heel Shin Test: [facility] bilaterally Motor  Motor Motor: Abnormal postural alignment and control Motor - Skilled Clinical Observations: grossly uncoordinated due to L LE foot drop, generalized weakness and endurance, and decreased balance  Mobility Bed Mobility Bed Mobility: Rolling Right;Rolling Left;Supine to Sit;Sit to Supine Rolling Right: Independent Rolling Left: Independent Supine to Sit: Independent Sit to Supine: Independent Transfers Transfers: Sit to Stand;Stand to Sit;Stand Pivot Transfers Sit to Stand: Supervision/Verbal cueing Stand to Sit: Supervision/Verbal cueing Stand Pivot Transfers: Supervision/Verbal cueing Stand Pivot Transfer Details: Verbal cues for safe use of DME/AE Stand Pivot Transfer Details (indicate cue type and reason): verbal cues for AD safety Transfer (Assistive device): Straight cane Locomotion  Gait Ambulation: Yes Gait Assistance: Supervision/Verbal cueing Gait Distance (Feet): 225 Feet Assistive device: Straight cane Gait Assistance Details: Verbal cues for safe use of DME/AE Gait Assistance Details: occasional verbal cues for AD safety and technique Gait Gait: Yes Gait Pattern: Impaired Gait Pattern: Decreased dorsiflexion - left;Decreased trunk rotation;Wide base of support;Step-to pattern;Decreased stance time - left;Left foot flat;Decreased stride length;Decreased weight shift to left;Left hip hike;Decreased step length - right;Decreased step length - left;Poor foot clearance - left;Poor foot clearance - right Gait velocity: decreased Stairs / Additional Locomotion Stairs: Yes Stairs Assistance: Supervision/Verbal cueing Stair Management Technique: One  rail Right Number of Stairs: 12 Height of Stairs: 6 Ramp: Supervision/Verbal cueing (SPC) Curb: Supervision/Verbal cueing [facility]) Wheelchair Mobility Wheelchair Mobility: Yes Wheelchair Assistance: Chartered loss adjuster: Both lower extermities;Right upper extremity Wheelchair Parts Management: Needs assistance Distance: >15f  Trunk/Postural Assessment  Cervical Assessment Cervical Assessment: Within Functional Limits Thoracic Assessment Thoracic Assessment: Within Functional Limits Lumbar Assessment Lumbar  Assessment: Within Functional Limits Postural Control Postural Control: Deficits on evaluation  Balance Balance Balance Assessed: Yes Static Sitting Balance Static Sitting - Balance Support: Feet supported;No upper extremity supported Static Sitting - Level of Assistance: 7: Independent Dynamic Sitting Balance Dynamic Sitting - Balance Support: Feet supported;No upper extremity supported Dynamic Sitting - Level of Assistance: 7: Independent Static Standing Balance Static Standing - Balance Support: No upper extremity supported Static Standing - Level of Assistance: 6: Modified independent (Device/Increase time) Dynamic Standing Balance Dynamic Standing - Balance Support: Right upper extremity supported (SPC) Dynamic Standing - Level of Assistance: 5: Stand by assistance (supervision) Extremity Assessment  RLE Assessment RLE Assessment: Exceptions to [facility] General Strength Comments: grossly generalized to 4+/5 LLE Assessment LLE Assessment: Exceptions to [facility] In General Strength Comments: grossly generalized to 4+/5 (except DF 3-/5 and PF 1/5)   Alfonse Alpers PT, DPT  Page Spiro, PT, DPT, CSRS 08/20/2020, 9:37 PM

## 2020-08-20 ENCOUNTER — Other Ambulatory Visit (HOSPITAL_COMMUNITY): Payer: Self-pay | Admitting: Physician Assistant

## 2020-08-20 ENCOUNTER — Inpatient Hospital Stay (HOSPITAL_COMMUNITY): Payer: Self-pay | Admitting: Occupational Therapy

## 2020-08-20 ENCOUNTER — Inpatient Hospital Stay (HOSPITAL_COMMUNITY): Payer: Self-pay | Admitting: Physical Therapy

## 2020-08-20 DIAGNOSIS — M21372 Foot drop, left foot: Secondary | ICD-10-CM

## 2020-08-20 MED ORDER — OXYCODONE HCL 5 MG PO TABS: 5.0000 mg | ORAL_TABLET | ORAL | Status: DC | PRN

## 2020-08-20 MED ORDER — GABAPENTIN 400 MG PO CAPS: 1200.0000 mg | ORAL_CAPSULE | Freq: Three times a day (TID) | ORAL | 0 refills | Status: DC

## 2020-08-20 MED ORDER — METHOCARBAMOL 500 MG PO TABS: 1000.0000 mg | ORAL_TABLET | Freq: Four times a day (QID) | ORAL | 0 refills | Status: DC

## 2020-08-20 MED ORDER — GABAPENTIN 300 MG PO CAPS: 900.0000 mg | ORAL_CAPSULE | Freq: Three times a day (TID) | ORAL | 0 refills | Status: DC

## 2020-08-20 MED ORDER — OXYCODONE HCL 10 MG PO TABS
10.0000 mg | ORAL_TABLET | ORAL | 0 refills | Status: DC | PRN
Start: 2020-08-20 — End: 2020-08-20

## 2020-08-20 MED ORDER — OXYCODONE HCL 5 MG PO TABS: 5.0000 mg | ORAL_TABLET | ORAL | 0 refills | Status: DC | PRN

## 2020-08-20 MED ORDER — ASPIRIN 81 MG PO TBEC: 81.0000 mg | DELAYED_RELEASE_TABLET | Freq: Every day | ORAL | 11 refills | Status: DC

## 2020-08-20 MED ORDER — ACETAMINOPHEN 325 MG PO TABS: 325.0000 mg | ORAL_TABLET | ORAL | Status: AC | PRN

## 2020-08-20 MED ORDER — GABAPENTIN 300 MG PO CAPS
300.0000 mg | ORAL_CAPSULE | Freq: Once | ORAL | Status: AC
  Administered 2020-08-20: 300 mg via ORAL
  Filled 2020-08-20: qty 1

## 2020-08-20 MED ORDER — OXYCODONE HCL 10 MG PO TABS: 10.0000 mg | ORAL_TABLET | ORAL | 0 refills | Status: DC | PRN

## 2020-08-20 MED ORDER — DOCUSATE SODIUM 100 MG PO CAPS: 100.0000 mg | ORAL_CAPSULE | Freq: Two times a day (BID) | ORAL | 0 refills | Status: DC

## 2020-08-20 MED ORDER — OXYCODONE HCL 10 MG PO TABS
5.0000 mg | ORAL_TABLET | ORAL | 0 refills | Status: DC | PRN
Start: 2020-08-20 — End: 2020-08-20

## 2020-08-20 MED ORDER — GABAPENTIN 400 MG PO CAPS
1200.0000 mg | ORAL_CAPSULE | Freq: Three times a day (TID) | ORAL | Status: DC
  Administered 2020-08-20 – 2020-08-21 (×3): 1200 mg via ORAL
  Filled 2020-08-20 (×3): qty 3

## 2020-08-20 MED ORDER — TRAMADOL HCL 50 MG PO TABS
50.0000 mg | ORAL_TABLET | Freq: Four times a day (QID) | ORAL | 0 refills | Status: DC
Start: 2020-08-20 — End: 2020-08-26

## 2020-08-20 MED ORDER — AMITRIPTYLINE HCL 10 MG PO TABS
10.0000 mg | ORAL_TABLET | Freq: Every day | ORAL | Status: DC
  Administered 2020-08-20: 10 mg via ORAL
  Filled 2020-08-20: qty 1

## 2020-08-20 MED ORDER — LIDOCAINE 5 % EX PTCH: 1.0000 | MEDICATED_PATCH | CUTANEOUS | 0 refills | Status: DC

## 2020-08-20 MED ORDER — OXYCODONE HCL 5 MG PO TABS
10.0000 mg | ORAL_TABLET | ORAL | Status: DC | PRN
  Administered 2020-08-20 – 2020-08-21 (×7): 10 mg via ORAL
  Filled 2020-08-20 (×8): qty 2

## 2020-08-20 MED ORDER — POLYETHYLENE GLYCOL 3350 17 G PO PACK: 17.0000 g | PACK | Freq: Two times a day (BID) | ORAL | 0 refills | Status: DC

## 2020-08-20 MED ORDER — AMITRIPTYLINE HCL 10 MG PO TABS: 10.0000 mg | ORAL_TABLET | Freq: Every day | ORAL | 0 refills | Status: DC

## 2020-08-20 MED FILL — traMADol HCL 50 MG TABS: 50 | 7 days supply | Qty: 28 | Fill #0

## 2020-08-20 MED FILL — METHOCARBAMOL 500 MG TABS: 500 | 17 days supply | Qty: 200 | Fill #0

## 2020-08-20 MED FILL — AMITRIPTYLINE HCL 10 MG TAB: 10 | 30 days supply | Qty: 30 | Fill #0

## 2020-08-20 MED FILL — GABAPENTIN 400 MG CAPSULE: 400 | 30 days supply | Qty: 270 | Fill #0

## 2020-08-20 MED FILL — oxyCODONE HCL 10 MG TABS: 10 | 5 days supply | Qty: 30 | Fill #0

## 2020-08-20 MED FILL — LIDOCAINE PATCH 5%: 5 | 30 days supply | Qty: 30 | Fill #0
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
## 2020-08-20 NOTE — Plan of Care (Signed)
  Problem: Consults Goal: RH GENERAL PATIENT EDUCATION Description: See Patient Education module for education specifics. Outcome: Progressing Goal: Skin Care Protocol Initiated - if Braden Score 18 or less Description: If consults are not indicated, leave blank or document N/A Outcome: Progressing   Problem: RH BOWEL ELIMINATION Goal: RH STG MANAGE BOWEL WITH ASSISTANCE Description: STG Manage Bowel with mod I Assistance. Outcome: Progressing Goal: RH STG MANAGE BOWEL W/MEDICATION W/ASSISTANCE Description: STG Manage Bowel with Medication with mod I Assistance. Outcome: Progressing   Problem: RH BLADDER ELIMINATION Goal: RH STG MANAGE BLADDER WITH ASSISTANCE Description: STG Manage Bladder With mod I Assistance Outcome: Progressing Goal: RH STG MANAGE BLADDER WITH EQUIPMENT WITH ASSISTANCE Description: STG Manage Bladder With Equipment With mod I Assistance Outcome: Progressing   Problem: RH SKIN INTEGRITY Goal: RH STG SKIN FREE OF INFECTION/BREAKDOWN Outcome: Progressing Goal: RH STG MAINTAIN SKIN INTEGRITY WITH ASSISTANCE Description: STG Maintain Skin Integrity With mod I Assistance. Outcome: Progressing Goal: RH STG ABLE TO PERFORM INCISION/WOUND CARE W/ASSISTANCE Description: STG Able To Perform Incision/Wound Care With mod I Assistance. Outcome: Progressing   Problem: RH SAFETY Goal: RH STG ADHERE TO SAFETY PRECAUTIONS W/ASSISTANCE/DEVICE Description: STG Adhere to Safety Precautions With reminders Outcome: Progressing Goal: RH STG DECREASED RISK OF FALL WITH ASSISTANCE Description: STG Decreased Risk of Fall With mod I Assistance. Outcome: Progressing   Problem: RH PAIN MANAGEMENT Goal: RH STG PAIN MANAGED AT OR BELOW PT'S PAIN GOAL Description: Pain level less than 4 on scale of 0-10 Outcome: Progressing   Problem: RH KNOWLEDGE DEFICIT GENERAL Goal: RH STG INCREASE KNOWLEDGE OF SELF CARE AFTER HOSPITALIZATION Description: Pt will be able to demonstrate  understanding of pharmacological and nonpharmacologic methods to manage pain with mod I assist.   Outcome: Progressing

## 2020-08-20 NOTE — Progress Notes (Addendum)
Patient ID: Steve [name], male   DOB: [birth year], 24 y.o.   MRN: [id number]  Met with pt to discuss discharge tomorrow. He is interested in applying for disability and will give him an application. Discussed going to county of residence DSS to apply for any other services he is eligible for and his minor children. His equipment should be delivered to his room today and OP will call regarding appointment. Will be seen by neuro-psych prior to going home early am. Feels ready to go home tomorrow.   2:56 PM Letter given to pt for Mom regarding being here in the hospital with him when first admitted.

## 2020-08-20 NOTE — Progress Notes (Signed)
Inpatient Rehabilitation Care Coordinator  Discharge Note  The overall goal for the admission was met for:   Discharge location: Yes-HOME TO UNCLE'S HOME WILL HAVE 24 HR SUPERVISION  Length of Stay: Yes-9 DAYS  Discharge activity level: Yes-MOD/I LEVEL  Home/community participation: Yes  Services provided included: MD, RD, PT, OT, RN, CM, TR, Pharmacy, Neuropsych and SW  Financial Services: Other: SELF PAY-MEDICAID PENDING  Follow-up services arranged: Outpatient: [facility]-PT WILL CALL PT TO SET UP APPOINTMENT, DME: [facility]-CANE [facility] TUB [facility]EN[facility] and Patient/Family has no preference for HH/DME agencies MATCH GIVEN TO MEDICATION ASSISTANCE, INFORMATION GIVEN REGARDING DSS AND SSD APPLICATIONS.  Comments (or additional information):PT DID WELL AND PROGRESSED QUICKLY. PLANNING ON STAYING WITH UNCLE AND THEN EVENTUALLY RETURN HOME TO [place]. HAPPY TO SEE HIS DAUGHTER'S TODAY  Patient/Family verbalized understanding of follow-up arrangements: Yes  Individual responsible for coordination of the follow-up plan: LEVINE-MOM [phone]  Confirmed correct DME delivered: Steve [name], Steve [name] 08/20/2020    Steve [name], Steve [name] 

## 2020-08-20 NOTE — Discharge Instructions (Signed)
Inpatient Rehab Discharge Instructions  Steve [name] Discharge date and time: No discharge date for patient encounter.   Activities/Precautions/ Functional Status: Activity: Nonweightbearing left upper extremity Diet: regular diet Wound Care: Routine skin checks Functional status:  ___ No restrictions     ___ Walk up steps independently ___ 24/7 supervision/assistance   ___ Walk up steps with assistance ___ Intermittent supervision/assistance  ___ Bathe/dress independently ___ Walk with walker     _x__ Bathe/dress with assistance ___ Walk Independently    ___ Shower independently ___ Walk with assistance    ___ Shower with assistance ___ No alcohol     ___ Return to work/school ________  Special Instructions: No driving smoking or alcohol   COMMUNITY REFERRALS UPON DISCHARGE:    Outpatient: PT             Agency: [facility] [address] [place] [address]  Phone: [phone]             Appointment Date/Time: Will contact patient regarding follow up appointment  Medical Equipment/Items Ordered:cane and tub bench                                                 Agency/Supplier:[facility]  [phone]  Match given to assist with his prescriptions-delivered prior to discharge to his room Resources given for DSS and applying for disability and services through DSS  My questions have been answered and I understand these instructions. I will adhere to these goals and the provided educational materials after my discharge from the hospital.  Patient/Caregiver Signature _______________________________ Date __________  Clinician Signature _______________________________________ Date __________  Please bring this form and your medication list with you to all your follow-up doctor's appointments.

## 2020-08-20 NOTE — Progress Notes (Signed)
Occupational Therapy Session Note  Patient Details  Name: Steve [name] MRN: [id number] Date of Birth: Dec 21, 1995  Today's Date: 08/20/2020 OT Individual Time: 3491-7915 OT Individual Time Calculation (min): 71 min    Short Term Goals: Week 1:  OT Short Term Goal 1 (Week 1): STGs equal to LTGs set at supervision to modified independent level overall.  Skilled Therapeutic Interventions/Progress Updates:    Pt with some increased pain in the LLE expressed to start session.  Nursing made aware and brought in nerve pain meds, which were the only ones he could have at the time.  He then completed bathing and dressing sit to stand at the sink.  He was able to wash his UB with supervision using modified technique to wash the RUE by placing the washcloth on his right knee and flexing forward, to work the arm up and down. He was able to wash LB with supervision, crossing the LLE over the right knee to wash the foot.  He dressed at supervision level as well donning his underpants, pants, and gripper socks in the same manner as well.  Pullover shirt was also donned at independent level.  He finished session with request to ambulate up the hallway with his cane and back at supervision level.  He continues to report increased tingling in the LLE like a "pins and needles"type of pain.  He was left sittingin the recliner with call button and phone in reach and safety belt in place.   Therapy Documentation Precautions:  Precautions Precautions: Fall Precaution Comments: L foot drop Restrictions Weight Bearing Restrictions: Yes LUE Weight Bearing: Non weight bearing LLE Weight Bearing: Weight bearing as tolerated  Pain:  See Pain Flowsheet for data   ADL: See Care Tool Section for some details of mobility and selfcare  Therapy/Group: Individual Therapy  Tamieka Rancourt OTR/L 08/20/2020, 4:30 PM

## 2020-08-20 NOTE — Progress Notes (Signed)
Physical Therapy Session Note  Patient Details  Name: Steve [name] MRN: [id number] Date of Birth: 1996/01/11  Today's Date: 08/20/2020 PT Individual Time: 0810-0904 and 2458-0998 PT Individual Time Calculation (min): 54 min and 62 min  Short Term Goals: Week 1:  PT Short Term Goal 1 (Week 1): Pt will transfer stand<>pivot with LRAD and CGA PT Short Term Goal 2 (Week 1): Pt will ambulate 42ft with LRAD min A PT Short Term Goal 3 (Week 1): Pt will perform simulated car transfer with LRAD and CGA  Skilled Therapeutic Interventions/Progress Updates:    Session 1: Pt received supine in bed and agreeable to therapy session. Supine>sitting EOB independently. Sit<>stands using SPC with supervision during session - educated pt on L LE positioning prior to standing to promote equal weightbearing. Stand pivot to w/c using SPC with supervision. Transported to/from gym in w/c for time management, pain management, and energy conservation. Gait training using SPC ~296ft to ortho gym with supervision - therapist cuing for increased L heel strike on initial contact (as pt able due to limited anterior tibialis activation). Ambulatory simulated car transfer (sedan height) using SPC with supervision. Gait training ~50ft up/down ramp and ~29ft over mulch using SPC with supervision for safety - pt demoing good safety awareness slowing down and ensuring proper AD placement prior to stepping forward. Stepped up/down curb using SPC with supervision and pt demoing good recall/carryover of AD sequencing with stepping. Gait training ~116ft back to main therpay gym using SPC - cuing for L weight shift during stance to promote more normalized gait mechanics - pt denies L LE pain with this. Discussed bracing/AFO for L ankle with Jesusita Oka, PA and he recommended pt discuss this during follow-up appointment with MD - therapist educated pt on this recommendation. Ascended/descended 12 steps using B HRs with R UE support and supervision -  reciprocal pattern on ascent and step-to pattern on descent. Gait training ~193ft, no AD, with CGA for steadying focusing on more symmetrical weight shift onto stance limbs. Transported remainder of distance back to room in w/c and pt requested to stay in w/c - left with needs in reach and chair alarm on.  Session 2: Pt received supine in bed and eager to participate in therapy session. Supine>sitting EOB independently. Sit<>stands with and without SPC with supervision for safety - no instances of instability noted. Stand pivot transfer to w/c using SPC with supervision.  Transported to/from gym in w/c for time management and energy conservation. Gait training ~30ft using SPC focusing on carryover of improved gait mechanics emphasized in AM session - increased L LE weight shift during stance phase and L heel strike on initial contact (as much as is able) - pt continues to demo compensatory L LE swing phase mechanics (excessive hip/knee flexion with slight hip hike) to accommodate lack of ankle DF AROM.  Therapy session focused on performing and educating patient on HEP, which included the following exercises: - Supine Active Straight Leg Raise - 1 x daily - 7 x weekly - 2 sets - 15 reps - Sidelying Hip Abduction - 1 x daily - 7 x weekly - 2 sets - 12 reps - Supine Bridge - 1 x daily - 7 x weekly - 2 sets - 15 reps - Supine Single Leg Ankle Pumps - 1 x daily - 7 x weekly - 2 sets - 10 reps - Squat with Chair Touch - 1 x daily - 7 x weekly - 2 sets - 10 reps - Sideways Step Touch -  1 x daily - 7 x weekly - 2 sets - 10 reps - Standing Hamstring Curl with Chair Support - 1 x daily - 7 x weekly - 2 sets - 15 reps Therapist educated pt on proper positioning/technique of each exercise as well as proper set-up to ensure patient safety - provided pt with printout. Pt ambulated short distances (~71ft) in therapy gym with and without SPC with supervision during session. Transported back to room and pt requesting to  remain seated in w/c - left with needs in reach and chair alarm on.  Therapy Documentation Precautions:  Precautions Precautions: Fall Precaution Comments: L foot drop Restrictions Weight Bearing Restrictions: Yes LUE Weight Bearing: Non weight bearing LLE Weight Bearing: Weight bearing as tolerated  Pain: Session 1: Pt premedicated - reports that his primary pain is "tingling" in L foot/ankle - during session pt reports getting "up and moving" helps decrease this pain.   Session 2: Pt reported pain level of 5-6/10 during session stating he primarily has "tingling" in L foot/ankle - RN notified and present for medication administration - therapist provided rest breaks, repositioning, distraction, and emotional support for pain management.   Therapy/Group: Individual Therapy  Ginny Forth , PT, DPT, CSRS  08/20/2020, 7:55 AM

## 2020-08-21 ENCOUNTER — Encounter (HOSPITAL_COMMUNITY): Payer: Self-pay | Admitting: Psychology

## 2020-08-21 NOTE — Plan of Care (Signed)
  Problem: Consults Goal: RH GENERAL PATIENT EDUCATION Description: See Patient Education module for education specifics. Outcome: Completed/Met Goal: Skin Care Protocol Initiated - if Braden Score 18 or less Description: If consults are not indicated, leave blank or document N/A Outcome: Completed/Met   Problem: RH BOWEL ELIMINATION Goal: RH STG MANAGE BOWEL WITH ASSISTANCE Description: STG Manage Bowel with mod I Assistance. Outcome: Completed/Met Goal: RH STG MANAGE BOWEL W/MEDICATION W/ASSISTANCE Description: STG Manage Bowel with Medication with mod I Assistance. Outcome: Completed/Met   Problem: RH BLADDER ELIMINATION Goal: RH STG MANAGE BLADDER WITH ASSISTANCE Description: STG Manage Bladder With mod I Assistance Outcome: Completed/Met Goal: RH STG MANAGE BLADDER WITH EQUIPMENT WITH ASSISTANCE Description: STG Manage Bladder With Equipment With mod I Assistance Outcome: Completed/Met   Problem: RH SKIN INTEGRITY Goal: RH STG SKIN FREE OF INFECTION/BREAKDOWN Outcome: Completed/Met Goal: RH STG MAINTAIN SKIN INTEGRITY WITH ASSISTANCE Description: STG Maintain Skin Integrity With mod I Assistance. Outcome: Completed/Met Goal: RH STG ABLE TO PERFORM INCISION/WOUND CARE W/ASSISTANCE Description: STG Able To Perform Incision/Wound Care With mod I Assistance. Outcome: Completed/Met   Problem: RH SAFETY Goal: RH STG ADHERE TO SAFETY PRECAUTIONS W/ASSISTANCE/DEVICE Description: STG Adhere to Safety Precautions With reminders Outcome: Completed/Met Goal: RH STG DECREASED RISK OF FALL WITH ASSISTANCE Description: STG Decreased Risk of Fall With mod I Assistance. Outcome: Completed/Met   Problem: RH PAIN MANAGEMENT Goal: RH STG PAIN MANAGED AT OR BELOW PT'S PAIN GOAL Description: Pain level less than 4 on scale of 0-10 Outcome: Completed/Met   Problem: RH KNOWLEDGE DEFICIT GENERAL Goal: RH STG INCREASE KNOWLEDGE OF SELF CARE AFTER HOSPITALIZATION Description: Pt will be  able to demonstrate understanding of pharmacological and nonpharmacologic methods to manage pain with mod I assist.   Outcome: Completed/Met
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
## 2020-08-21 NOTE — Progress Notes (Signed)
Patient discharged home with brother. All belongings and equipment sent home. Escorted out by NT

## 2020-08-24 ENCOUNTER — Telehealth: Payer: Self-pay | Admitting: Registered Nurse

## 2020-08-24 NOTE — Telephone Encounter (Signed)
Transitional Care call  Patient name: Steve [name]  DOB: Oct 15, 1996 1. Are you/is patient experiencing any problems since coming home? No a. Are there any questions regarding any aspect of care? No 2. Are there any questions regarding medications administration/dosing? No a. Are meds being taken as prescribed? Yes b. "Patient should review meds with caller to confirm" Medication List Reviewed. 3. Have there been any falls? No 4. Has Home Health been to the house and/or have they contacted you? He has a scheduled appointment with [facility]. a. If not, have you tried to contact them? NA b. Can we help you contact them? No 5. Are bowels and bladder emptying properly? Yes a. Are there any unexpected incontinence issues? No b. If applicable, is patient following bowel/bladder programs? NA 6. Any fevers, problems with breathing, unexpected pain? No 7. Are there any skin problems or new areas of breakdown? No 8. Has the patient/family member arranged specialty MD follow up (ie cardiology/neurology/renal/surgical/etc.)?  He was instructed to call Dr. Amanda Pea to schedule HFU appointment. He has an appointment with Dr Randie Heinz a. Can we help arrange? No 9. Does the patient need any other services or support that we can help arrange? No 10. Are caregivers following through as expected in assisting the patient? Yes 11. Has the patient quit smoking, drinking alcohol, or using drugs as recommended? (                        )  Appointment date/time 09/01/2020  arrival time 11:20 for 11:40 with Dr Allena Katz. At [address] [facility] [address]

## 2020-08-25 ENCOUNTER — Ambulatory Visit: Payer: Medicaid Other | Admitting: Physical Therapy

## 2020-08-26 ENCOUNTER — Telehealth: Payer: Self-pay

## 2020-08-26 ENCOUNTER — Ambulatory Visit: Payer: BC Managed Care – PPO | Attending: Physician Assistant

## 2020-08-26 ENCOUNTER — Other Ambulatory Visit: Payer: Self-pay

## 2020-08-26 DIAGNOSIS — S52352A Displaced comminuted fracture of shaft of radius, left arm, initial encounter for closed fracture: Secondary | ICD-10-CM | POA: Diagnosis present

## 2020-08-26 DIAGNOSIS — M79602 Pain in left arm: Secondary | ICD-10-CM

## 2020-08-26 DIAGNOSIS — M25672 Stiffness of left ankle, not elsewhere classified: Secondary | ICD-10-CM

## 2020-08-26 DIAGNOSIS — S71132A Puncture wound without foreign body, left thigh, initial encounter: Secondary | ICD-10-CM | POA: Diagnosis not present

## 2020-08-26 DIAGNOSIS — R262 Difficulty in walking, not elsewhere classified: Secondary | ICD-10-CM

## 2020-08-26 DIAGNOSIS — M79605 Pain in left leg: Secondary | ICD-10-CM | POA: Diagnosis present

## 2020-08-26 DIAGNOSIS — M25642 Stiffness of left hand, not elsewhere classified: Secondary | ICD-10-CM

## 2020-08-26 DIAGNOSIS — R2689 Other abnormalities of gait and mobility: Secondary | ICD-10-CM

## 2020-08-26 MED ORDER — TRAMADOL HCL 50 MG PO TABS
50.0000 mg | ORAL_TABLET | Freq: Four times a day (QID) | ORAL | 0 refills | Status: DC
Start: 2020-08-26 — End: 2020-09-01

## 2020-08-26 NOTE — Telephone Encounter (Signed)
Patient called requesting a refill on Oxycodone. Patient has a TC appt on 09/01/2020. Pharmacy: [facility] in [place]

## 2020-08-26 NOTE — Telephone Encounter (Signed)
Filled one time.

## 2020-08-26 NOTE — Telephone Encounter (Signed)
He was instructed to wean narcotics, which he stated he wanted to do.  He may continue with non-narcotic management. Thanks.

## 2020-08-27 ENCOUNTER — Telehealth: Payer: Self-pay

## 2020-08-27 NOTE — Telephone Encounter (Signed)
Patient advised to schedule his Hospital follow up appointment ASAP. Tramadol was sent by Dr. Allena Katz. However the Oxycodone 10 MG will not be refilled, by Dr. Allena Katz.
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
## 2020-08-28 NOTE — Patient Instructions (Addendum)
  AROM for L fingers

## 2020-09-01 ENCOUNTER — Other Ambulatory Visit: Payer: Self-pay

## 2020-09-01 ENCOUNTER — Encounter
Payer: BC Managed Care – PPO | Attending: Physical Medicine & Rehabilitation | Admitting: Physical Medicine & Rehabilitation

## 2020-09-01 ENCOUNTER — Encounter: Payer: Medicaid Other | Admitting: Physical Medicine & Rehabilitation

## 2020-09-01 ENCOUNTER — Encounter: Payer: Self-pay | Admitting: Physical Medicine & Rehabilitation

## 2020-09-01 VITALS — BP 134/84 | HR 95 | Temp 98.8°F | Ht 72.0 in | Wt 268.0 lb

## 2020-09-01 DIAGNOSIS — R202 Paresthesia of skin: Secondary | ICD-10-CM | POA: Insufficient documentation

## 2020-09-01 DIAGNOSIS — M21372 Foot drop, left foot: Secondary | ICD-10-CM | POA: Insufficient documentation

## 2020-09-01 DIAGNOSIS — M792 Neuralgia and neuritis, unspecified: Secondary | ICD-10-CM | POA: Diagnosis not present

## 2020-09-01 DIAGNOSIS — G8918 Other acute postprocedural pain: Secondary | ICD-10-CM | POA: Diagnosis not present

## 2020-09-01 DIAGNOSIS — F4311 Post-traumatic stress disorder, acute: Secondary | ICD-10-CM | POA: Diagnosis present

## 2020-09-01 DIAGNOSIS — T07XXXA Unspecified multiple injuries, initial encounter: Secondary | ICD-10-CM | POA: Diagnosis present

## 2020-09-01 MED ORDER — ASPIRIN 81 MG PO TBEC
81.0000 mg | DELAYED_RELEASE_TABLET | Freq: Every day | ORAL | 11 refills | Status: DC
Start: 2020-09-01 — End: 2020-11-19

## 2020-09-01 MED ORDER — AMITRIPTYLINE HCL 25 MG PO TABS: 25.0000 mg | ORAL_TABLET | Freq: Every day | ORAL | 1 refills | Status: DC

## 2020-09-01 MED ORDER — TRAMADOL HCL 50 MG PO TABS
50.0000 mg | ORAL_TABLET | Freq: Four times a day (QID) | ORAL | 0 refills | Status: AC
Start: 2020-09-01 — End: ?
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
## 2020-09-03 ENCOUNTER — Other Ambulatory Visit: Payer: Self-pay

## 2020-09-03 DIAGNOSIS — W3400XA Accidental discharge from unspecified firearms or gun, initial encounter: Secondary | ICD-10-CM

## 2020-09-11 ENCOUNTER — Encounter: Payer: Self-pay | Admitting: Vascular Surgery

## 2020-09-11 ENCOUNTER — Ambulatory Visit (INDEPENDENT_AMBULATORY_CARE_PROVIDER_SITE_OTHER): Payer: Self-pay | Admitting: Vascular Surgery

## 2020-09-11 ENCOUNTER — Encounter (HOSPITAL_COMMUNITY): Payer: Self-pay

## 2020-09-11 ENCOUNTER — Other Ambulatory Visit: Payer: Self-pay

## 2020-09-11 ENCOUNTER — Ambulatory Visit (HOSPITAL_COMMUNITY)
Admit: 2020-09-11 | Discharge: 2020-09-11 | Disposition: A | Discharge status: Home / Self Care | Payer: Medicaid Other | Attending: Vascular Surgery | Admitting: Vascular Surgery

## 2020-09-11 VITALS — BP 117/78 | HR 102 | Temp 98.3°F | Resp 20 | Ht 72.0 in | Wt 268.0 lb

## 2020-09-11 DIAGNOSIS — W3400XA Accidental discharge from unspecified firearms or gun, initial encounter: Secondary | ICD-10-CM

## 2020-09-11 DIAGNOSIS — S45192A Other specified injury of brachial artery, left side, initial encounter: Secondary | ICD-10-CM
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
## 2020-09-15 ENCOUNTER — Telehealth: Payer: Self-pay | Admitting: *Deleted

## 2020-09-15 NOTE — Telephone Encounter (Signed)
Danny called and left a message to give him a call back.  I tried but he did not answer.  There is no VM set up.  I spoke with his mother who is at work.  She will try and call him.

## 2020-09-16 ENCOUNTER — Other Ambulatory Visit: Payer: Self-pay

## 2020-09-16 DIAGNOSIS — W3400XA Accidental discharge from unspecified firearms or gun, initial encounter: Secondary | ICD-10-CM

## 2020-09-16 MED ORDER — GABAPENTIN 400 MG PO CAPS
1200.0000 mg | ORAL_CAPSULE | Freq: Three times a day (TID) | ORAL | 1 refills | Status: DC
Start: 2020-09-16 — End: 2021-12-08

## 2020-09-16 NOTE — Telephone Encounter (Signed)
I called patient back and he needs a refill on Gabapentin. Refill called into [facility] in [place]

## 2020-09-29 ENCOUNTER — Encounter: Payer: Self-pay | Admitting: Physical Medicine & Rehabilitation

## 2020-09-29 ENCOUNTER — Other Ambulatory Visit: Payer: Self-pay

## 2020-09-29 ENCOUNTER — Encounter (HOSPITAL_BASED_OUTPATIENT_CLINIC_OR_DEPARTMENT_OTHER): Payer: BC Managed Care – PPO | Admitting: Physical Medicine & Rehabilitation

## 2020-09-29 VITALS — BP 118/78 | HR 88 | Temp 98.6°F | Ht 72.0 in | Wt 268.0 lb

## 2020-09-29 DIAGNOSIS — M792 Neuralgia and neuritis, unspecified: Secondary | ICD-10-CM

## 2020-09-29 DIAGNOSIS — G8918 Other acute postprocedural pain: Secondary | ICD-10-CM

## 2020-09-29 DIAGNOSIS — R202 Paresthesia of skin: Secondary | ICD-10-CM

## 2020-09-29 DIAGNOSIS — M21372 Foot drop, left foot: Secondary | ICD-10-CM

## 2020-09-29 DIAGNOSIS — F4311 Post-traumatic stress disorder, acute: Secondary | ICD-10-CM

## 2020-09-29 DIAGNOSIS — T07XXXA Unspecified multiple injuries, initial encounter: Secondary | ICD-10-CM | POA: Diagnosis not present

## 2020-09-29 MED ORDER — AMITRIPTYLINE HCL 25 MG PO TABS: 25.0000 mg | ORAL_TABLET | Freq: Every day | ORAL | 1 refills | Status: AC
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
## 2020-10-01 ENCOUNTER — Ambulatory Visit: Payer: Medicaid Other | Attending: Physician Assistant | Admitting: Physical Therapy

## 2020-10-01 DIAGNOSIS — S71132A Puncture wound without foreign body, left thigh, initial encounter: Secondary | ICD-10-CM | POA: Insufficient documentation

## 2020-10-01 DIAGNOSIS — M79605 Pain in left leg: Secondary | ICD-10-CM | POA: Insufficient documentation

## 2020-10-01 DIAGNOSIS — S52352A Displaced comminuted fracture of shaft of radius, left arm, initial encounter for closed fracture: Secondary | ICD-10-CM | POA: Insufficient documentation

## 2020-10-01 DIAGNOSIS — M25672 Stiffness of left ankle, not elsewhere classified: Secondary | ICD-10-CM | POA: Insufficient documentation

## 2020-10-01 DIAGNOSIS — R262 Difficulty in walking, not elsewhere classified: Secondary | ICD-10-CM | POA: Insufficient documentation

## 2020-10-01 DIAGNOSIS — W3400XA Accidental discharge from unspecified firearms or gun, initial encounter: Secondary | ICD-10-CM | POA: Insufficient documentation

## 2020-10-01 DIAGNOSIS — R2689 Other abnormalities of gait and mobility: Secondary | ICD-10-CM | POA: Insufficient documentation

## 2020-10-06 ENCOUNTER — Telehealth: Payer: Self-pay | Admitting: Physical Therapy

## 2020-10-06 ENCOUNTER — Ambulatory Visit: Payer: Medicaid Other | Admitting: Physical Therapy

## 2020-10-06 ENCOUNTER — Encounter: Payer: Self-pay | Admitting: Physical Therapy

## 2020-10-06 ENCOUNTER — Other Ambulatory Visit: Payer: Self-pay

## 2020-10-06 DIAGNOSIS — S52352A Displaced comminuted fracture of shaft of radius, left arm, initial encounter for closed fracture: Secondary | ICD-10-CM | POA: Diagnosis present

## 2020-10-06 DIAGNOSIS — R2689 Other abnormalities of gait and mobility: Secondary | ICD-10-CM | POA: Diagnosis not present

## 2020-10-06 DIAGNOSIS — R262 Difficulty in walking, not elsewhere classified: Secondary | ICD-10-CM

## 2020-10-06 DIAGNOSIS — M79605 Pain in left leg: Secondary | ICD-10-CM

## 2020-10-06 DIAGNOSIS — S71132A Puncture wound without foreign body, left thigh, initial encounter: Secondary | ICD-10-CM

## 2020-10-06 DIAGNOSIS — W3400XA Accidental discharge from unspecified firearms or gun, initial encounter: Secondary | ICD-10-CM | POA: Diagnosis not present

## 2020-10-06 DIAGNOSIS — M25672 Stiffness of left ankle, not elsewhere classified: Secondary | ICD-10-CM | POA: Diagnosis not present

## 2020-10-06 NOTE — Telephone Encounter (Signed)
Spoke to patient regarding no show to appointment Dec 2nd. He opted to reschedule for Dec 3rd.
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
## 2020-10-08 ENCOUNTER — Other Ambulatory Visit: Payer: Self-pay

## 2020-10-08 ENCOUNTER — Ambulatory Visit: Payer: Medicaid Other

## 2020-10-08 DIAGNOSIS — M25672 Stiffness of left ankle, not elsewhere classified: Secondary | ICD-10-CM

## 2020-10-08 DIAGNOSIS — R2689 Other abnormalities of gait and mobility: Secondary | ICD-10-CM

## 2020-10-08 DIAGNOSIS — S71132A Puncture wound without foreign body, left thigh, initial encounter: Secondary | ICD-10-CM

## 2020-10-08 DIAGNOSIS — S52352A Displaced comminuted fracture of shaft of radius, left arm, initial encounter for closed fracture: Secondary | ICD-10-CM

## 2020-10-08 DIAGNOSIS — M79605 Pain in left leg: Secondary | ICD-10-CM

## 2020-10-08 DIAGNOSIS — W3400XA Accidental discharge from unspecified firearms or gun, initial encounter: Secondary | ICD-10-CM

## 2020-10-08 DIAGNOSIS — R262 Difficulty in walking, not elsewhere classified: Secondary | ICD-10-CM
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
## 2020-10-14 ENCOUNTER — Other Ambulatory Visit: Payer: Self-pay

## 2020-10-14 ENCOUNTER — Encounter: Payer: Self-pay | Admitting: Physical Therapy

## 2020-10-14 ENCOUNTER — Ambulatory Visit: Payer: Medicaid Other | Admitting: Physical Therapy

## 2020-10-14 DIAGNOSIS — R2689 Other abnormalities of gait and mobility: Secondary | ICD-10-CM

## 2020-10-14 DIAGNOSIS — S71132A Puncture wound without foreign body, left thigh, initial encounter: Secondary | ICD-10-CM | POA: Diagnosis not present

## 2020-10-14 DIAGNOSIS — S52352A Displaced comminuted fracture of shaft of radius, left arm, initial encounter for closed fracture: Secondary | ICD-10-CM

## 2020-10-14 DIAGNOSIS — M79605 Pain in left leg: Secondary | ICD-10-CM

## 2020-10-14 DIAGNOSIS — M25672 Stiffness of left ankle, not elsewhere classified: Secondary | ICD-10-CM

## 2020-10-14 DIAGNOSIS — W3400XA Accidental discharge from unspecified firearms or gun, initial encounter: Secondary | ICD-10-CM

## 2020-10-14 DIAGNOSIS — R262 Difficulty in walking, not elsewhere classified: Secondary | ICD-10-CM
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
## 2020-10-21 ENCOUNTER — Encounter: Payer: Self-pay | Admitting: Physical Therapy

## 2020-10-21 ENCOUNTER — Ambulatory Visit: Payer: Medicaid Other | Admitting: Physical Therapy

## 2020-10-21 ENCOUNTER — Other Ambulatory Visit: Payer: Self-pay

## 2020-10-21 DIAGNOSIS — S71132A Puncture wound without foreign body, left thigh, initial encounter: Secondary | ICD-10-CM | POA: Diagnosis not present

## 2020-10-21 DIAGNOSIS — R2689 Other abnormalities of gait and mobility: Secondary | ICD-10-CM

## 2020-10-21 DIAGNOSIS — R262 Difficulty in walking, not elsewhere classified: Secondary | ICD-10-CM

## 2020-10-21 DIAGNOSIS — M25672 Stiffness of left ankle, not elsewhere classified: Secondary | ICD-10-CM

## 2020-10-21 DIAGNOSIS — M79605 Pain in left leg: Secondary | ICD-10-CM

## 2020-10-21 DIAGNOSIS — S52352A Displaced comminuted fracture of shaft of radius, left arm, initial encounter for closed fracture: Secondary | ICD-10-CM
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
## 2020-10-26 ENCOUNTER — Ambulatory Visit: Payer: Medicaid Other | Admitting: Physical Therapy

## 2020-10-27 ENCOUNTER — Encounter
Payer: BC Managed Care – PPO | Attending: Physical Medicine & Rehabilitation | Admitting: Physical Medicine & Rehabilitation

## 2020-10-27 DIAGNOSIS — R202 Paresthesia of skin: Secondary | ICD-10-CM | POA: Insufficient documentation

## 2020-10-27 DIAGNOSIS — G8918 Other acute postprocedural pain: Secondary | ICD-10-CM | POA: Insufficient documentation

## 2020-10-27 DIAGNOSIS — F4311 Post-traumatic stress disorder, acute: Secondary | ICD-10-CM | POA: Insufficient documentation

## 2020-10-27 DIAGNOSIS — M21372 Foot drop, left foot: Secondary | ICD-10-CM | POA: Insufficient documentation

## 2020-10-27 DIAGNOSIS — T07XXXA Unspecified multiple injuries, initial encounter: Secondary | ICD-10-CM | POA: Insufficient documentation

## 2020-10-27 DIAGNOSIS — M792 Neuralgia and neuritis, unspecified: Secondary | ICD-10-CM | POA: Insufficient documentation

## 2020-10-28 ENCOUNTER — Ambulatory Visit: Payer: Medicaid Other

## 2020-11-19 ENCOUNTER — Ambulatory Visit: Payer: Medicaid Other | Admitting: Neurology

## 2020-11-19 ENCOUNTER — Encounter: Payer: Self-pay | Admitting: Neurology

## 2020-11-19 VITALS — BP 129/63 | HR 84 | Ht 72.0 in | Wt 267.5 lb

## 2020-11-19 DIAGNOSIS — S71132A Puncture wound without foreign body, left thigh, initial encounter: Secondary | ICD-10-CM | POA: Insufficient documentation

## 2020-11-19 DIAGNOSIS — R29898 Other symptoms and signs involving the musculoskeletal system: Secondary | ICD-10-CM

## 2020-11-19 DIAGNOSIS — R202 Paresthesia of skin: Secondary | ICD-10-CM | POA: Insufficient documentation

## 2020-11-19 DIAGNOSIS — S71132S Puncture wound without foreign body, left thigh, sequela: Secondary | ICD-10-CM | POA: Diagnosis not present

## 2020-11-19 MED ORDER — DICLOFENAC SODIUM 1 % EX GEL: CUTANEOUS | 11 refills | Status: AC

## 2020-11-19 MED ORDER — LIDOCAINE-PRILOCAINE 2.5-2.5 % EX CREA: TOPICAL_CREAM | CUTANEOUS | 5 refills | Status: AC

## 2020-11-19 MED ORDER — DULOXETINE HCL 60 MG PO CPEP: 60.0000 mg | ORAL_CAPSULE | Freq: Every day | ORAL | 12 refills | Status: DC
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
## 2020-11-20 ENCOUNTER — Encounter: Payer: Self-pay | Admitting: Neurology

## 2020-12-07 ENCOUNTER — Other Ambulatory Visit (HOSPITAL_COMMUNITY): Payer: Medicaid Other

## 2020-12-07 ENCOUNTER — Ambulatory Visit: Payer: Medicaid Other

## 2020-12-09 ENCOUNTER — Telehealth: Payer: Self-pay | Admitting: *Deleted

## 2020-12-09 ENCOUNTER — Encounter: Payer: Medicaid Other | Admitting: Neurology

## 2020-12-09 ENCOUNTER — Encounter: Payer: Self-pay | Admitting: Neurology

## 2020-12-09 NOTE — Telephone Encounter (Signed)
No showed NCV/EMG.

## 2020-12-21 ENCOUNTER — Other Ambulatory Visit: Payer: Self-pay | Admitting: Neurology

## 2021-12-08 ENCOUNTER — Other Ambulatory Visit: Payer: Self-pay

## 2021-12-08 ENCOUNTER — Encounter: Payer: Medicaid Other | Attending: Physical Medicine & Rehabilitation | Admitting: Physical Medicine & Rehabilitation

## 2021-12-08 ENCOUNTER — Encounter: Payer: Self-pay | Admitting: Physical Medicine & Rehabilitation

## 2021-12-08 VITALS — BP 115/76 | HR 94 | Ht 72.0 in | Wt 282.0 lb

## 2021-12-08 DIAGNOSIS — M79672 Pain in left foot: Secondary | ICD-10-CM | POA: Diagnosis present

## 2021-12-08 DIAGNOSIS — R202 Paresthesia of skin: Secondary | ICD-10-CM | POA: Insufficient documentation

## 2021-12-08 DIAGNOSIS — S71132S Puncture wound without foreign body, left thigh, sequela: Secondary | ICD-10-CM | POA: Insufficient documentation

## 2021-12-08 DIAGNOSIS — M21372 Foot drop, left foot: Secondary | ICD-10-CM | POA: Diagnosis present

## 2021-12-08 MED ORDER — GABAPENTIN 300 MG PO CAPS: 300.0000 mg | ORAL_CAPSULE | Freq: Three times a day (TID) | ORAL | 4 refills | Status: AC

## 2021-12-08 NOTE — Patient Instructions (Addendum)
GABAPENTIN: 300MG  AT NIGHT FOR 3 DAYS  THEN 300MG  TWICE DAILY FOR 3 DAYS THEN 300MG  THREE X DAILY
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
## 2021-12-09 ENCOUNTER — Ambulatory Visit: Payer: Medicaid Other | Attending: Physical Medicine & Rehabilitation

## 2021-12-09 DIAGNOSIS — M21372 Foot drop, left foot: Secondary | ICD-10-CM | POA: Diagnosis not present

## 2021-12-09 DIAGNOSIS — S71132S Puncture wound without foreign body, left thigh, sequela: Secondary | ICD-10-CM | POA: Insufficient documentation

## 2021-12-09 DIAGNOSIS — M6281 Muscle weakness (generalized): Secondary | ICD-10-CM | POA: Diagnosis present

## 2021-12-09 DIAGNOSIS — M25672 Stiffness of left ankle, not elsewhere classified: Secondary | ICD-10-CM | POA: Diagnosis present

## 2021-12-09 DIAGNOSIS — R2681 Unsteadiness on feet: Secondary | ICD-10-CM | POA: Insufficient documentation

## 2021-12-09 DIAGNOSIS — R2689 Other abnormalities of gait and mobility: Secondary | ICD-10-CM | POA: Insufficient documentation

## 2021-12-09 DIAGNOSIS — R202 Paresthesia of skin: Secondary | ICD-10-CM | POA: Insufficient documentation

## 2021-12-09 NOTE — Patient Instructions (Signed)
Access Code: [email or web address] URL: [email or web address] Date: 12/09/2021 Prepared by: Elmer Bales  Exercises Seated Ankle Plantarflexion with Resistance - 2 x daily - 5 x weekly - 3 sets - 10 reps Ankle Eversion with Resistance - 2 x daily - 5 x weekly - 3 sets - 10 reps Ankle Inversion with Resistance - 2 x daily - 5 x weekly - 3 sets - 10 reps Gastroc Stretch on Wall - 3-4 x daily - 7 x weekly - 1 sets - 3 reps - 1 min hold
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
## 2021-12-29 ENCOUNTER — Ambulatory Visit: Payer: Medicaid Other | Attending: Physical Medicine & Rehabilitation | Admitting: Physical Therapy

## 2021-12-29 DIAGNOSIS — M25672 Stiffness of left ankle, not elsewhere classified: Secondary | ICD-10-CM | POA: Insufficient documentation

## 2021-12-29 DIAGNOSIS — R2681 Unsteadiness on feet: Secondary | ICD-10-CM | POA: Insufficient documentation

## 2021-12-29 DIAGNOSIS — R2689 Other abnormalities of gait and mobility: Secondary | ICD-10-CM | POA: Insufficient documentation

## 2021-12-29 DIAGNOSIS — M6281 Muscle weakness (generalized): Secondary | ICD-10-CM | POA: Insufficient documentation

## 2021-12-31 ENCOUNTER — Ambulatory Visit: Payer: Medicaid Other | Admitting: Physical Therapy

## 2021-12-31 ENCOUNTER — Other Ambulatory Visit: Payer: Self-pay

## 2021-12-31 DIAGNOSIS — R2689 Other abnormalities of gait and mobility: Secondary | ICD-10-CM

## 2021-12-31 DIAGNOSIS — M6281 Muscle weakness (generalized): Secondary | ICD-10-CM

## 2021-12-31 DIAGNOSIS — M25672 Stiffness of left ankle, not elsewhere classified: Secondary | ICD-10-CM | POA: Diagnosis present

## 2021-12-31 DIAGNOSIS — R2681 Unsteadiness on feet: Secondary | ICD-10-CM | POA: Diagnosis present

## 2021-12-31 NOTE — Patient Instructions (Signed)
Access Code: [email or web address] ?URL: [email or web address] ?Date: 12/31/2021 ?Prepared by: Alethia Berthold Glady Ouderkirk ? ?Exercises ?Seated Ankle Plantarflexion with Resistance - 2 x daily - 5 x weekly - 3 sets - 10 reps ?Ankle Eversion with Resistance - 2 x daily - 5 x weekly - 3 sets - 10 reps ?Ankle Inversion with Resistance - 2 x daily - 5 x weekly - 3 sets - 10 reps ?Gastroc Stretch on Wall - 3-4 x daily - 7 x weekly - 1 sets - 3 reps - 1 min hold ?Ankle Dorsiflexion with Resistance - 1 x daily - 7 x weekly - 3 sets - 10 reps - 3s hold ?Towel Scrunches - 1 x daily - 7 x weekly - 3 sets - 10 reps ?Standing Heel Raises - 1 x daily - 7 x weekly - 3 sets - 10 reps ?Forward Step Down - 1 x daily - 7 x weekly - 3 sets - 10 reps ? ?
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
## 2022-01-04 ENCOUNTER — Encounter: Payer: Self-pay | Admitting: Rehabilitation

## 2022-01-04 ENCOUNTER — Other Ambulatory Visit: Payer: Self-pay

## 2022-01-04 ENCOUNTER — Ambulatory Visit: Payer: Medicaid Other | Admitting: Rehabilitation

## 2022-01-04 ENCOUNTER — Ambulatory Visit: Payer: Medicaid Other | Admitting: Physical Therapy

## 2022-01-04 ENCOUNTER — Encounter: Payer: Medicaid Other | Admitting: Neurology

## 2022-01-04 DIAGNOSIS — R2689 Other abnormalities of gait and mobility: Secondary | ICD-10-CM

## 2022-01-04 DIAGNOSIS — M6281 Muscle weakness (generalized): Secondary | ICD-10-CM

## 2022-01-04 DIAGNOSIS — R2681 Unsteadiness on feet: Secondary | ICD-10-CM
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
## 2022-01-07 ENCOUNTER — Ambulatory Visit: Payer: Medicaid Other | Admitting: Physical Therapy

## 2022-01-07 ENCOUNTER — Other Ambulatory Visit: Payer: Self-pay

## 2022-01-07 DIAGNOSIS — R2689 Other abnormalities of gait and mobility: Secondary | ICD-10-CM

## 2022-01-07 DIAGNOSIS — R2681 Unsteadiness on feet: Secondary | ICD-10-CM

## 2022-01-07 DIAGNOSIS — M6281 Muscle weakness (generalized): Secondary | ICD-10-CM
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
## 2022-01-10 ENCOUNTER — Encounter: Payer: Self-pay | Admitting: Neurology

## 2022-01-10 ENCOUNTER — Encounter: Payer: Medicaid Other | Admitting: Neurology

## 2022-01-11 ENCOUNTER — Other Ambulatory Visit: Payer: Self-pay

## 2022-01-11 ENCOUNTER — Ambulatory Visit: Payer: Medicaid Other | Admitting: Physical Therapy

## 2022-01-11 ENCOUNTER — Encounter: Payer: Self-pay | Admitting: Physical Therapy

## 2022-01-11 DIAGNOSIS — R2689 Other abnormalities of gait and mobility: Secondary | ICD-10-CM

## 2022-01-11 DIAGNOSIS — M6281 Muscle weakness (generalized): Secondary | ICD-10-CM

## 2022-01-11 DIAGNOSIS — R2681 Unsteadiness on feet: Secondary | ICD-10-CM
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
## 2022-01-14 ENCOUNTER — Ambulatory Visit: Payer: Medicaid Other | Admitting: Physical Therapy

## 2022-01-18 ENCOUNTER — Other Ambulatory Visit: Payer: Self-pay

## 2022-01-18 ENCOUNTER — Ambulatory Visit: Payer: Medicaid Other | Admitting: Physical Therapy

## 2022-01-18 ENCOUNTER — Encounter: Payer: Self-pay | Admitting: Physical Therapy

## 2022-01-18 DIAGNOSIS — M25672 Stiffness of left ankle, not elsewhere classified: Secondary | ICD-10-CM

## 2022-01-18 DIAGNOSIS — R2689 Other abnormalities of gait and mobility: Secondary | ICD-10-CM

## 2022-01-18 DIAGNOSIS — M6281 Muscle weakness (generalized): Secondary | ICD-10-CM | POA: Diagnosis not present

## 2022-01-18 DIAGNOSIS — R2681 Unsteadiness on feet: Secondary | ICD-10-CM
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
## 2022-01-21 ENCOUNTER — Encounter: Payer: Self-pay | Admitting: Physical Therapy

## 2022-01-21 ENCOUNTER — Other Ambulatory Visit: Payer: Self-pay

## 2022-01-21 ENCOUNTER — Ambulatory Visit: Payer: Medicaid Other | Admitting: Physical Therapy

## 2022-01-21 DIAGNOSIS — M6281 Muscle weakness (generalized): Secondary | ICD-10-CM | POA: Diagnosis not present

## 2022-01-21 DIAGNOSIS — R2689 Other abnormalities of gait and mobility: Secondary | ICD-10-CM

## 2022-01-21 DIAGNOSIS — M25672 Stiffness of left ankle, not elsewhere classified: Secondary | ICD-10-CM

## 2022-01-21 DIAGNOSIS — R2681 Unsteadiness on feet: Secondary | ICD-10-CM
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
## 2022-01-25 ENCOUNTER — Ambulatory Visit: Payer: Medicaid Other

## 2022-02-01 ENCOUNTER — Ambulatory Visit: Payer: Medicaid Other | Admitting: Physical Therapy

## 2022-02-08 ENCOUNTER — Ambulatory Visit: Payer: Medicaid Other

## 2022-02-15 ENCOUNTER — Ambulatory Visit: Payer: Medicaid Other | Attending: Physical Medicine & Rehabilitation

## 2022-02-15 ENCOUNTER — Telehealth: Payer: Self-pay

## 2022-02-15 DIAGNOSIS — M25672 Stiffness of left ankle, not elsewhere classified: Secondary | ICD-10-CM | POA: Insufficient documentation

## 2022-02-15 DIAGNOSIS — M6281 Muscle weakness (generalized): Secondary | ICD-10-CM | POA: Insufficient documentation

## 2022-02-15 DIAGNOSIS — R2681 Unsteadiness on feet: Secondary | ICD-10-CM | POA: Insufficient documentation

## 2022-02-15 DIAGNOSIS — R2689 Other abnormalities of gait and mobility: Secondary | ICD-10-CM | POA: Insufficient documentation

## 2022-02-15 NOTE — Telephone Encounter (Signed)
PT called pt's number with no answer. Called mother's number and she was going to try to get him to call as was last scheduled visit for PT he missed today. ?Elmer Bales, PT, DPT, NCS ? ?

## 2022-02-16 ENCOUNTER — Encounter: Payer: Medicaid Other | Admitting: Neurology

## 2022-02-22 ENCOUNTER — Ambulatory Visit: Payer: Medicaid Other

## 2022-02-22 DIAGNOSIS — R2689 Other abnormalities of gait and mobility: Secondary | ICD-10-CM

## 2022-02-22 DIAGNOSIS — R2681 Unsteadiness on feet: Secondary | ICD-10-CM

## 2022-02-22 DIAGNOSIS — M6281 Muscle weakness (generalized): Secondary | ICD-10-CM | POA: Diagnosis present

## 2022-02-22 DIAGNOSIS — M25672 Stiffness of left ankle, not elsewhere classified: Secondary | ICD-10-CM
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
## 2022-03-01 ENCOUNTER — Ambulatory Visit: Payer: Medicaid Other | Attending: Physical Medicine & Rehabilitation | Admitting: Physical Therapy

## 2022-03-01 DIAGNOSIS — M6281 Muscle weakness (generalized): Secondary | ICD-10-CM | POA: Insufficient documentation

## 2022-03-01 DIAGNOSIS — R2681 Unsteadiness on feet: Secondary | ICD-10-CM | POA: Insufficient documentation

## 2022-03-01 DIAGNOSIS — R2689 Other abnormalities of gait and mobility: Secondary | ICD-10-CM | POA: Insufficient documentation
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
## 2022-03-11 ENCOUNTER — Ambulatory Visit: Payer: Medicaid Other | Admitting: Physical Therapy

## 2022-03-16 ENCOUNTER — Ambulatory Visit: Payer: Medicaid Other | Admitting: Physical Therapy

## 2022-03-16 ENCOUNTER — Encounter: Payer: Medicaid Other | Attending: Physical Medicine & Rehabilitation | Admitting: Physical Medicine & Rehabilitation

## 2022-03-16 DIAGNOSIS — S71132S Puncture wound without foreign body, left thigh, sequela: Secondary | ICD-10-CM | POA: Insufficient documentation

## 2022-03-16 DIAGNOSIS — R202 Paresthesia of skin: Secondary | ICD-10-CM | POA: Insufficient documentation

## 2022-03-16 DIAGNOSIS — M79672 Pain in left foot: Secondary | ICD-10-CM | POA: Insufficient documentation

## 2022-03-16 DIAGNOSIS — M21372 Foot drop, left foot: Secondary | ICD-10-CM | POA: Insufficient documentation

## 2022-03-23 ENCOUNTER — Ambulatory Visit: Payer: Medicaid Other | Admitting: Physical Therapy

## 2022-03-30 ENCOUNTER — Ambulatory Visit: Payer: Medicaid Other

## 2022-03-30 NOTE — Therapy (Signed)
[facility] [address] [place], [place], [address] Phone: [phone]   Fax:  [phone]  Patient Details  Name: Steve [name] MRN: [id number] Date of Birth: 04/24/1996 Referring Provider:  No ref. provider found  Encounter Date: 03/30/2022  PHYSICAL THERAPY DISCHARGE SUMMARY  Visits from Start of Care: 9  Current functional level related to goals / functional outcomes: Patient to d/c from skilled PT services due to No Show/Cancellation policy. He was provided with an HEP with emphasis on LE strength and flexibility.    Remaining deficits: LE weakness, abnormal gait, LE pain   Education / Equipment: Patient previously provided with HEP.      SHORT TERM GOALS= LTGs   LONG TERM GOALS:  Target date:  04/01/22   Pt will be independent with progressive HEP for strengthening, ROM and balance to continue gains on own. Baseline: PT continues to update Goal status: IN PROGRESS   2.  Pt will increase left ankle DF to 0 degrees actively for improved mobility with gait and transfers.  Baseline: 02/22/22 A/PROM=-6/0 degrees Goal status: IN PROGRESS   3.  Pt will increase gait speed to >1.46ms for improved community mobility with better left toe off.  Baseline: 02/22/22 0.964m Goal status: IN PROGRESS   4.  Pt will increase SLS time on LLE to 20 sec for improved balance and stability.  Baseline: 02/22/22 13 sec on left Goal status: IN PROGRESS   5.  Pt will be able to perform at least 2 reps of LLE calf raise for improved functional strength and push off.  Baseline: 02/22/22 unable to perform full rep Goal status: IN PROGRESS   6.  Pt will be able to perform 4 steps with reciprocal pattern without rails mod I for improved functional strength. Baseline: 02/22/22 needs rails to perform reciprocal with descent Goal status: INITIAL  Patient agrees to discharge. Patient goals were  partially met- unable to fully progress  toward LTGs due to limited attendance to PT . Patient is being discharged due to  No Show/Cancellation policy .  JeDebbora DusPT JeDebbora DusPT, DPT, CBIS  03/30/2022, 10:32 AM  [email or web address]: [phone] Fax:  [phone]
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
## 2023-07-22 ENCOUNTER — Emergency Department (HOSPITAL_BASED_OUTPATIENT_CLINIC_OR_DEPARTMENT_OTHER)
Admission: EM | Admit: 2023-07-22 | Discharge: 2023-07-23 | Disposition: A | Discharge status: Home / Self Care | Payer: Medicaid Other | Attending: Emergency Medicine | Admitting: Emergency Medicine

## 2023-07-22 ENCOUNTER — Other Ambulatory Visit: Payer: Self-pay

## 2023-07-22 DIAGNOSIS — Z5189 Encounter for other specified aftercare: Secondary | ICD-10-CM

## 2023-07-22 DIAGNOSIS — Z4800 Encounter for change or removal of nonsurgical wound dressing: Secondary | ICD-10-CM | POA: Insufficient documentation
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
